# Patient Record
Sex: Male | Born: 1955 | Race: White | Hispanic: No | Marital: Married | Smoking: Never smoker
Health system: Southern US, Community
[De-identification: ages and names within clinical notes are randomized; demographics above are authoritative.]

## PROBLEM LIST (undated history)

## (undated) DIAGNOSIS — K219 Gastro-esophageal reflux disease without esophagitis: Secondary | ICD-10-CM

## (undated) DIAGNOSIS — I10 Essential (primary) hypertension: Secondary | ICD-10-CM

## (undated) DIAGNOSIS — Z87442 Personal history of urinary calculi: Secondary | ICD-10-CM

## (undated) DIAGNOSIS — K760 Fatty (change of) liver, not elsewhere classified: Secondary | ICD-10-CM

## (undated) DIAGNOSIS — T7801XA Anaphylactic reaction due to peanuts, initial encounter: Secondary | ICD-10-CM

## (undated) DIAGNOSIS — C4490 Unspecified malignant neoplasm of skin, unspecified: Secondary | ICD-10-CM

## (undated) DIAGNOSIS — G4730 Sleep apnea, unspecified: Secondary | ICD-10-CM

## (undated) DIAGNOSIS — L509 Urticaria, unspecified: Secondary | ICD-10-CM

## (undated) DIAGNOSIS — E119 Type 2 diabetes mellitus without complications: Secondary | ICD-10-CM

## (undated) DIAGNOSIS — Z9889 Other specified postprocedural states: Secondary | ICD-10-CM

## (undated) HISTORY — PX: OTHER SURGICAL HISTORY: SHX169

## (undated) HISTORY — DX: Urticaria, unspecified: L50.9

## (undated) HISTORY — PX: TONSILLECTOMY: SUR1361

## (undated) HISTORY — PX: ESOPHAGOGASTRODUODENOSCOPY ENDOSCOPY: SHX5814

---

## 2007-04-26 ENCOUNTER — Emergency Department (HOSPITAL_COMMUNITY): Admission: EM | Admit: 2007-04-26 | Discharge: 2007-04-27 | Payer: Self-pay | Admitting: Emergency Medicine

## 2011-01-08 LAB — I-STAT 8, (EC8 V) (CONVERTED LAB)
BUN: 12
Bicarbonate: 25.7 — ABNORMAL HIGH
Chloride: 106
HCT: 47
Hemoglobin: 16
Operator id: 282201
Potassium: 4.1
Sodium: 140

## 2011-01-08 LAB — POCT CARDIAC MARKERS
Myoglobin, poc: 57
Operator id: 282201
Troponin i, poc: <0.05

## 2011-01-08 LAB — CBC
HCT: 44.3
Hemoglobin: 15.2
MCHC: 34.4
MCV: 93
Platelets: 216
RDW: 12.3

## 2011-01-08 LAB — DIFFERENTIAL
Basophils Absolute: 0
Basophils Relative: 0
Eosinophils Absolute: 0.2
Eosinophils Relative: 2
Monocytes Absolute: 1

## 2011-03-30 ENCOUNTER — Emergency Department (HOSPITAL_COMMUNITY)
Admission: EM | Admit: 2011-03-30 | Discharge: 2011-03-30 | Disposition: A | Discharge status: Home Health | Payer: 59 | Source: Home / Self Care

## 2011-03-30 ENCOUNTER — Encounter: Payer: Self-pay | Admitting: Emergency Medicine

## 2011-03-30 DIAGNOSIS — J209 Acute bronchitis, unspecified: Secondary | ICD-10-CM

## 2011-03-30 HISTORY — DX: Sleep apnea, unspecified: G47.30

## 2011-03-30 HISTORY — DX: Essential (primary) hypertension: I10

## 2011-03-30 LAB — POCT URINALYSIS DIP (DEVICE)
Hgb urine dipstick: NEGATIVE
Nitrite: NEGATIVE
Protein, ur: 30 mg/dL — AB
pH: 5.5 (ref 5.0–8.0)

## 2011-03-30 MED ORDER — ALBUTEROL SULFATE HFA 108 (90 BASE) MCG/ACT IN AERS: 2.0000 | INHALATION_SPRAY | RESPIRATORY_TRACT | Status: DC | PRN

## 2011-03-30 MED ORDER — IPRATROPIUM BROMIDE 0.02 % IN SOLN
0.5000 mg | Freq: Once | RESPIRATORY_TRACT | Status: AC
  Administered 2011-03-30: 0.5 mg via RESPIRATORY_TRACT

## 2011-03-30 MED ORDER — ALBUTEROL SULFATE (5 MG/ML) 0.5% IN NEBU
2.5000 mg | INHALATION_SOLUTION | Freq: Once | RESPIRATORY_TRACT | Status: AC
  Administered 2011-03-30: 2.5 mg via RESPIRATORY_TRACT

## 2011-03-30 MED ORDER — DOXYCYCLINE HYCLATE 100 MG PO CAPS: 100.0000 mg | ORAL_CAPSULE | Freq: Two times a day (BID) | ORAL | Status: AC

## 2011-03-30 MED ORDER — BENZONATATE 100 MG PO CAPS: ORAL_CAPSULE | ORAL | Status: AC

## 2011-03-30 MED ORDER — PREDNISONE 20 MG PO TABS: 20.0000 mg | ORAL_TABLET | Freq: Two times a day (BID) | ORAL | Status: AC

## 2011-03-30 MED ORDER — ALBUTEROL SULFATE (5 MG/ML) 0.5% IN NEBU
INHALATION_SOLUTION | RESPIRATORY_TRACT | Status: AC
  Filled 2011-03-30: qty 1

## 2011-03-30 NOTE — ED Provider Notes (Signed)
History     CSN: [id number] Arrival date & time: 03/30/2011  9:27 AM   None     Chief Complaint  Patient presents with  . Cough    (Consider location/radiation/quality/duration/timing/severity/associated sxs/prior treatment) HPI Comments: Onset of cough 3 days ago. Chest congestion, cough is sometimes productive with greenish dark brown phlegm. No fever but has had chills. Has been in bed for the last 2 days. Appetite is decreased but is drinking fluids.   Patient is a 55 y.o. male presenting with cough. The history is provided by the patient.  Cough This is a new problem. The current episode started more than 2 days ago. The problem occurs every few minutes. The problem has not changed since onset.The cough is productive of purulent sputum. There has been no fever. Associated symptoms include chills, headaches, shortness of breath and wheezing. Pertinent negatives include no chest pain, no ear pain and no sore throat. He has tried cough syrup for the symptoms. The treatment provided mild relief. He is not a smoker. His past medical history does not include asthma.    Past Medical History  Diagnosis Date  . Hypertension     History reviewed. No pertinent past surgical history.  History reviewed. No pertinent family history.  History  Substance Use Topics  . Smoking status: Never Smoker   . Smokeless tobacco: Not on file  . Alcohol Use: Yes     Occasional      Review of Systems  Constitutional: Positive for chills.  HENT: Positive for congestion. Negative for ear pain, sore throat, postnasal drip and sinus pressure.   Respiratory: Positive for cough, shortness of breath and wheezing.   Cardiovascular: Negative for chest pain.  Genitourinary: Positive for decreased urine volume. Negative for dysuria, frequency and hematuria.  Neurological: Positive for headaches.    Allergies  Review of patient's allergies indicates no known allergies.  Home Medications   Current  Outpatient Rx  Name Route Sig Dispense Refill  . NYQUIL COLD & FLU PO Oral Take by mouth.      . METOPROLOL TARTRATE 25 MG PO TABS Oral Take 25 mg by mouth 2 (two) times daily.      . NEBIVOLOL HCL 10 MG PO TABS Oral Take 10 mg by mouth daily.      . TELMISARTAN 80 MG PO TABS Oral Take 80 mg by mouth daily.        BP 148/97  Pulse 81  Temp(Src) 99.7 F (37.6 C) (Oral)  Resp 16  SpO2 97%  Physical Exam  Nursing note and vitals reviewed. Constitutional: He appears well-developed and well-nourished. No distress.  HENT:  Head: Normocephalic and atraumatic.  Right Ear: Tympanic membrane, external ear and ear canal normal.  Left Ear: Tympanic membrane, external ear and ear canal normal.  Nose: Nose normal.  Mouth/Throat: Uvula is midline, oropharynx is clear and moist and mucous membranes are normal. No oropharyngeal exudate, posterior oropharyngeal edema or posterior oropharyngeal erythema.  Neck: Neck supple.  Cardiovascular: Normal rate, regular rhythm and normal heart sounds.   Pulmonary/Chest: Effort normal. No respiratory distress. He has decreased breath sounds (bilat). He has no wheezes. He has no rhonchi. He has no rales.  Lymphadenopathy:    He has no cervical adenopathy.  Neurological: He is alert.  Skin: Skin is warm and dry.  Psychiatric: He has a normal mood and affect.    ED Course  Procedures (including critical care time)  Labs Reviewed  POCT URINALYSIS DIP (DEVICE) -  Abnormal; Notable for the following:    Glucose, UA 100 (*)    Bilirubin Urine SMALL (*)    Protein, ur 30 (*)    All other components within normal limits  POCT URINALYSIS DIPSTICK  POCT URINALYSIS DIPSTICK   No results found.   No diagnosis found.    MDM  Pt reports symptomatic improvement with nebulizer treatment. Lungs CTA.         Melody Comas, [place] 03/30/11 1145

## 2011-03-30 NOTE — ED Notes (Signed)
Friday afternoon started having cough, got worse ever since. Stayed in bed all weekend. Cough, runny nose, chills, severe headache. Also has discolored, malodorous urine.

## 2011-03-31 NOTE — ED Provider Notes (Signed)
Medical screening examination/treatment/procedure(s) were performed by non-physician practitioner and as supervising physician I was immediately available for consultation/collaboration.  Corrie Mckusick, MD 03/31/11 [phone]

## 2012-03-24 ENCOUNTER — Emergency Department (HOSPITAL_COMMUNITY)
Admission: EM | Admit: 2012-03-24 | Discharge: 2012-03-25 | Disposition: A | Discharge status: Home / Self Care | Payer: 59 | Attending: Emergency Medicine | Admitting: Emergency Medicine

## 2012-03-24 DIAGNOSIS — T7809XA Anaphylactic reaction due to other food products, initial encounter: Secondary | ICD-10-CM | POA: Insufficient documentation

## 2012-03-24 DIAGNOSIS — Y939 Activity, unspecified: Secondary | ICD-10-CM | POA: Insufficient documentation

## 2012-03-24 DIAGNOSIS — R42 Dizziness and giddiness: Secondary | ICD-10-CM | POA: Insufficient documentation

## 2012-03-24 DIAGNOSIS — T782XXA Anaphylactic shock, unspecified, initial encounter: Secondary | ICD-10-CM

## 2012-03-24 DIAGNOSIS — L299 Pruritus, unspecified: Secondary | ICD-10-CM | POA: Insufficient documentation

## 2012-03-24 DIAGNOSIS — Y929 Unspecified place or not applicable: Secondary | ICD-10-CM | POA: Insufficient documentation

## 2012-03-24 DIAGNOSIS — R21 Rash and other nonspecific skin eruption: Secondary | ICD-10-CM | POA: Insufficient documentation

## 2012-03-24 DIAGNOSIS — I10 Essential (primary) hypertension: Secondary | ICD-10-CM | POA: Insufficient documentation

## 2012-03-24 DIAGNOSIS — T628X1A Toxic effect of other specified noxious substances eaten as food, accidental (unintentional), initial encounter: Secondary | ICD-10-CM | POA: Insufficient documentation

## 2012-03-24 MED ORDER — SODIUM CHLORIDE 0.9 % IV BOLUS (SEPSIS)
1000.0000 mL | Freq: Once | INTRAVENOUS | Status: AC
  Administered 2012-03-24: 1000 mL via INTRAVENOUS

## 2012-03-24 MED ORDER — FAMOTIDINE IN NACL 20-0.9 MG/50ML-% IV SOLN
20.0000 mg | Freq: Once | INTRAVENOUS | Status: AC
  Administered 2012-03-24: 20 mg via INTRAVENOUS
  Filled 2012-03-24: qty 50

## 2012-03-24 MED ORDER — ONDANSETRON HCL 4 MG/2ML IJ SOLN
4.0000 mg | Freq: Once | INTRAMUSCULAR | Status: AC
  Administered 2012-03-24: 4 mg via INTRAVENOUS
  Filled 2012-03-24: qty 2

## 2012-03-24 MED ORDER — METHYLPREDNISOLONE SODIUM SUCC 125 MG IJ SOLR
125.0000 mg | Freq: Once | INTRAMUSCULAR | Status: AC
  Administered 2012-03-24: 125 mg via INTRAVENOUS

## 2012-03-24 MED ORDER — METHYLPREDNISOLONE SODIUM SUCC 125 MG IJ SOLR
INTRAMUSCULAR | Status: AC
  Administered 2012-03-24: 125 mg via INTRAVENOUS
  Filled 2012-03-24: qty 2

## 2012-03-24 MED ORDER — EPINEPHRINE 0.15 MG/0.3ML IJ DEVI
0.1500 mg | Freq: Once | INTRAMUSCULAR | Status: AC
  Administered 2012-03-24: 0.15 mg via INTRAMUSCULAR

## 2012-03-24 NOTE — ED Notes (Signed)
EDP at bedside  

## 2012-03-24 NOTE — ED Notes (Addendum)
Pt was brought into triage room.  Pt diaphoretic and pale, pt has visble rash, states that his throat is swelling and that he is going to "pass out". Pt then passes out, gray and diaphoretic in color, not breathing.  EPI pen given IM.  Pt begins breathing again, placed on 15L NRB.  Pt becomes more alert.  Moved to trauma B

## 2012-03-24 NOTE — ED Notes (Signed)
Patient currently sitting up in bed; no respiratory or acute distress noted.  Patient updated on plan of care; informed patient that we are currently waiting on disposition from EDP.  Patient denies any other needs at this time.  Zofran given to help with nausea.  Will continue to monitor.

## 2012-03-24 NOTE — ED Provider Notes (Signed)
History     CSN: [id number]  Arrival date & time 03/24/12  2137   First MD Initiated Contact with Patient 03/24/12 2150      Chief Complaint  Patient presents with  . Allergic Reaction    (Consider location/radiation/quality/duration/timing/severity/associated sxs/prior treatment) Patient is a 56 y.o. male presenting with allergic reaction. The history is provided by the patient.  Allergic Reaction The primary symptoms are  shortness of breath, dizziness and rash. Primary symptoms comment: tongue swelling.  syncope that started after eating a [place]  coconut cream pie The current episode started 1 to 2 hours ago. The problem has been rapidly worsening.  The shortness of breath began today.  The rash began today. Location: diffuse. The rash is associated with blisters and itching.  Significant symptoms also include itching.    Past Medical History  Diagnosis Date  . Hypertension   . Sleep apnea     No past surgical history on file.  No family history on file.  History  Substance Use Topics  . Smoking status: Never Smoker   . Smokeless tobacco: Not on file  . Alcohol Use: Yes     Comment: Occasional      Review of Systems  Respiratory: Positive for shortness of breath.   Skin: Positive for itching and rash.  Neurological: Positive for dizziness.  All other systems reviewed and are negative.    Allergies  Review of patient's allergies indicates no known allergies.  Home Medications   Current Outpatient Rx  Name  Route  Sig  Dispense  Refill  . ALPRAZOLAM 1 MG PO TABS   Oral   Take 0.5 mg by mouth at bedtime.         . ASPIRIN EC 81 MG PO TBEC   Oral   Take 81 mg by mouth every morning.         Marland Kitchen DICLOFENAC SODIUM 75 MG PO TBEC   Oral   Take 75 mg by mouth daily as needed. For pain and inflammation         . NEBIVOLOL HCL 10 MG PO TABS   Oral   Take 10 mg by mouth every morning.          Marland Kitchen SIMVASTATIN 80 MG PO TABS   Oral   Take 80 mg by  mouth at bedtime.         . TELMISARTAN 80 MG PO TABS   Oral   Take 80 mg by mouth every morning.            BP 119/82  Pulse 58  Temp 97.6 F (36.4 C) (Oral)  Resp 18  SpO2 97%  Physical Exam  Nursing note and vitals reviewed. Constitutional: He is oriented to person, place, and time. He appears well-developed and well-nourished. He appears distressed.  HENT:  Head: Normocephalic and atraumatic.  Mouth/Throat: Oropharynx is clear and moist.  Eyes: Conjunctivae normal and EOM are normal. Pupils are equal, round, and reactive to light.  Neck: Normal range of motion. Neck supple.  Cardiovascular: Normal rate, regular rhythm and intact distal pulses.   No murmur heard. Pulmonary/Chest: Effort normal and breath sounds normal. No respiratory distress. He has no wheezes. He has no rales.  Abdominal: Soft. He exhibits no distension. There is no tenderness. There is no rebound and no guarding.  Musculoskeletal: Normal range of motion. He exhibits no edema and no tenderness.       Cool, pale toes with >3sec cap refill.  NOrmal  sensation and pulse.  Neurological: He is alert and oriented to person, place, and time.  Skin: Skin is warm. Rash noted. Rash is urticarial. He is diaphoretic. No erythema. There is pallor.  Psychiatric: He has a normal mood and affect. His behavior is normal.    ED Course  Procedures (including critical care time)  Labs Reviewed - No data to display No results found.   No diagnosis found.    MDM   Patient arrived with allergic reaction and anaphylaxis. The reaction started after eating Hilda Lias calendar [place] coconut cream pie.  Diffuse hives, tongue swelling, and diaphoresis, hypotension and syncope. Patient was given an EpiPen which significantly improved his symptoms within 2-3 minutes. On reevaluation after epi the patient is much improved.    Patient was given IV Solu-Medrol, Pepcid but he took 50 mg of Benadryl prior to arrival. Blood pressure  improved to 110/60. Heart rate normal and oxygen saturations 95%. He complained of feeling a little shaky but otherwise much better. He has a history of hypertension and hyperlipidemia but denies any cardiac stents or MIs in the past. He denies any chest pain or shortness of breath. Will serve patient for 4 hours to ensure no recurrent symptoms to see if he is stable for discharge home.        Gwyneth Sprout, MD 03/24/12 2312

## 2012-03-24 NOTE — ED Notes (Signed)
Patient given crackers and peanut butter, per request.  OKed by EDP.

## 2012-03-24 NOTE — ED Notes (Signed)
Pt began having sob, swelling of tongue and feelings that he would pass out as well as diaphoresis.  Pt arrives POV with wife.  Syncope in triage.  Rash on abdomen visible.  Pt took 2 benadryl pta.

## 2012-03-25 MED ORDER — PREDNISONE 20 MG PO TABS: 60.0000 mg | ORAL_TABLET | Freq: Every day | ORAL | Status: DC

## 2012-03-25 MED ORDER — DIPHENHYDRAMINE HCL 25 MG PO TABS: 25.0000 mg | ORAL_TABLET | Freq: Four times a day (QID) | ORAL | Status: DC

## 2012-03-25 MED ORDER — FAMOTIDINE 20 MG PO TABS: 20.0000 mg | ORAL_TABLET | Freq: Two times a day (BID) | ORAL | Status: DC

## 2012-03-25 MED ORDER — EPINEPHRINE 0.3 MG/0.3ML IJ DEVI: 0.3000 mg | Freq: Once | INTRAMUSCULAR | Status: DC

## 2012-03-25 NOTE — ED Notes (Signed)
Patient currently resting quietly in bed; no respiratory or acute distress noted.  Patient states that he is ready to go home; EPD and resident aware -- patient informed that we are currently waiting on the disposition from EDP.  Patient denies any other needs at this time; will continue to monitor.

## 2012-03-25 NOTE — ED Notes (Signed)
Patient given copy of discharge paperwork.  Went over discharge instructions with patient.  Instructed patient to take prescriptions as directed, to carry Epipen at all times, to follow up with allergist, and to return to the ED for new, worsening, or concerning symptoms.

## 2014-01-12 ENCOUNTER — Emergency Department (HOSPITAL_COMMUNITY)
Admission: EM | Admit: 2014-01-12 | Discharge: 2014-01-12 | Disposition: A | Discharge status: Home / Self Care | Payer: 59 | Attending: Emergency Medicine | Admitting: Emergency Medicine

## 2014-01-12 ENCOUNTER — Encounter (HOSPITAL_COMMUNITY): Payer: Self-pay | Admitting: Emergency Medicine

## 2014-01-12 DIAGNOSIS — T4995XA Adverse effect of unspecified topical agent, initial encounter: Secondary | ICD-10-CM | POA: Insufficient documentation

## 2014-01-12 DIAGNOSIS — I10 Essential (primary) hypertension: Secondary | ICD-10-CM | POA: Insufficient documentation

## 2014-01-12 DIAGNOSIS — R220 Localized swelling, mass and lump, head: Secondary | ICD-10-CM | POA: Insufficient documentation

## 2014-01-12 DIAGNOSIS — R221 Localized swelling, mass and lump, neck: Secondary | ICD-10-CM

## 2014-01-12 DIAGNOSIS — R21 Rash and other nonspecific skin eruption: Secondary | ICD-10-CM | POA: Diagnosis not present

## 2014-01-12 DIAGNOSIS — Z7982 Long term (current) use of aspirin: Secondary | ICD-10-CM | POA: Diagnosis not present

## 2014-01-12 DIAGNOSIS — Z791 Long term (current) use of non-steroidal anti-inflammatories (NSAID): Secondary | ICD-10-CM | POA: Insufficient documentation

## 2014-01-12 DIAGNOSIS — IMO0002 Reserved for concepts with insufficient information to code with codable children: Secondary | ICD-10-CM | POA: Diagnosis not present

## 2014-01-12 DIAGNOSIS — Z79899 Other long term (current) drug therapy: Secondary | ICD-10-CM | POA: Diagnosis not present

## 2014-01-12 DIAGNOSIS — T7840XA Allergy, unspecified, initial encounter: Secondary | ICD-10-CM

## 2014-01-12 HISTORY — DX: Anaphylactic reaction due to peanuts, initial encounter: T78.01XA

## 2014-01-12 MED ORDER — PREDNISONE 20 MG PO TABS: 60.0000 mg | ORAL_TABLET | Freq: Every day | ORAL | Status: DC

## 2014-01-12 MED ORDER — METHYLPREDNISOLONE SODIUM SUCC 125 MG IJ SOLR
INTRAMUSCULAR | Status: AC
  Filled 2014-01-12: qty 2

## 2014-01-12 MED ORDER — FAMOTIDINE IN NACL 20-0.9 MG/50ML-% IV SOLN
20.0000 mg | Freq: Once | INTRAVENOUS | Status: AC
  Administered 2014-01-12: 20 mg via INTRAVENOUS

## 2014-01-12 MED ORDER — METHYLPREDNISOLONE SODIUM SUCC 125 MG IJ SOLR
125.0000 mg | Freq: Once | INTRAMUSCULAR | Status: AC
  Administered 2014-01-12: 125 mg via INTRAVENOUS

## 2014-01-12 MED ORDER — SODIUM CHLORIDE 0.9 % IV BOLUS (SEPSIS)
1000.0000 mL | Freq: Once | INTRAVENOUS | Status: AC
Start: 2014-01-12 — End: 2014-01-12
  Administered 2014-01-12: 1000 mL via INTRAVENOUS

## 2014-01-12 MED ORDER — FAMOTIDINE IN NACL 20-0.9 MG/50ML-% IV SOLN
INTRAVENOUS | Status: AC
  Filled 2014-01-12: qty 50

## 2014-01-12 MED ORDER — DIPHENHYDRAMINE HCL 50 MG/ML IJ SOLN
INTRAMUSCULAR | Status: AC
  Administered 2014-01-12: 50 mg
  Filled 2014-01-12: qty 1

## 2014-01-12 MED ORDER — EPINEPHRINE HCL 1 MG/ML IJ SOLN
INTRAMUSCULAR | Status: AC
  Filled 2014-01-12: qty 1

## 2014-01-12 MED ORDER — EPINEPHRINE HCL 1 MG/ML IJ SOLN
0.3000 mg | Freq: Once | INTRAMUSCULAR | Status: AC
  Administered 2014-01-12: 0.3 mg via INTRAMUSCULAR

## 2014-01-12 NOTE — Discharge Instructions (Signed)
Food Allergy °A food allergy occurs from eating something you are sensitive to. Food allergies occur in all age groups. It may be passed to you from your parents (heredity).  °CAUSES  °Some common causes are cow's milk, seafood, eggs, nuts (including peanut butter), wheat, and soybeans. °SYMPTOMS  °Common problems are:  °· Swelling around the mouth. °· An itchy, red rash. °· Hives. °· Vomiting. °· Diarrhea. °Severe allergic reactions are life-threatening. This reaction is called anaphylaxis. It can cause the mouth and throat to swell. This makes it hard to breathe and swallow. In severe reactions, only a small amount of food may be fatal within seconds. °HOME CARE INSTRUCTIONS  °· If you are unsure what caused the reaction, keep a diary of foods eaten and symptoms that followed. Avoid foods that cause reactions. °· If hives or rash are present: °¨ Take medicines as directed. °¨ Use an over-the-counter antihistamine (diphenhydramine) to treat hives and itching as needed. °¨ Apply cold compresses to the skin or take baths in cool water. Avoid hot baths or showers. These will increase the redness and itching. °· If you are severely allergic: °¨ Hospitalization is often required following a severe reaction. °¨ Wear a medical alert bracelet or necklace that describes the allergy. °¨ Carry your anaphylaxis kit or epinephrine injection with you at all times. Both you and your family members should know how to use this. This can be lifesaving if you have a severe reaction. If epinephrine is used, it is important for you to seek immediate medical care or call your local emergency services (911 in [place] When the epinephrine wears off, it can be followed by a delayed reaction, which can be fatal. °· Replace your epinephrine immediately after use in case of another reaction. °· Ask your caregiver for instructions if you have not been taught how to use an epinephrine injection. °· Do not drive until medicines used to treat the  reaction have worn off, unless approved by your caregiver. °SEEK MEDICAL CARE IF:  °· You suspect a food allergy. Symptoms generally happen within 30 minutes of eating a food. °· Your symptoms have not gone away within 2 days. See your caregiver sooner if symptoms are getting worse. °· You develop new symptoms. °· You want to retest yourself with a food or drink you think causes an allergic reaction. Never do this if an anaphylactic reaction to that food or drink has happened before. °· There is a return of the symptoms which brought you to your caregiver. °SEEK IMMEDIATE MEDICAL CARE IF:  °· You have trouble breathing, are wheezing, or you have a tight feeling in your chest or throat. °· You have a swollen mouth, or you have hives, swelling, or itching all over your body. Use your epinephrine injection immediately. This is given into the outside of your thigh, deep into the muscle. Following use of the epinephrine injection, seek help right away. °Seek immediate medical care or call your local emergency services (911 in [place] °MAKE SURE YOU:  °· Understand these instructions. °· Will watch your condition. °· Will get help right away if you are not doing well or get worse. °Document Released: 04/03/2000 Document Revised: 06/29/2011 Document Reviewed: 11/24/2007 °[employer]® Patient Information ©2015 [employer]. This information is not intended to replace advice given to you by your health care provider. Make sure you discuss any questions you have with your health care provider. ° °

## 2014-01-12 NOTE — ED Notes (Signed)
Patient discharged home with prescription for Prednisone.  Patient ambulatory with steady gait; discharged home in good condition.

## 2014-01-12 NOTE — ED Notes (Signed)
Pt states he started having allergic reaction to something, states it started approx 30 mins ago, now with swelling of lips and eyes, states previous anaphylaxis.

## 2014-01-12 NOTE — ED Notes (Signed)
Patient states he feels better and is ready to go home.  Dr. Alvino Chapel at bedside to talk with patient.

## 2014-01-12 NOTE — ED Provider Notes (Signed)
CSN: [id number]     Arrival date & time 01/12/14  0120 History   First MD Initiated Contact with Patient 01/12/14 0123     Chief Complaint  Patient presents with  . Allergic Reaction     (Consider location/radiation/quality/duration/timing/severity/associated sxs/prior Treatment) Patient is a 58 y.o. male presenting with allergic reaction. The history is provided by the patient.  Allergic Reaction Presenting symptoms: itching, rash and swelling   Presenting symptoms: no difficulty breathing, no difficulty swallowing and no wheezing    patient has a history of anaphylaxis to peanuts. States that he woke with some itching to his hands no swelling to his face. Came to the ER. It began around 30 minutes prior to arrival. He states that they sat in the parking lot for a few minutes to see if it got better. Patient states he now has more swelling in his face feels as if his lips and tongue are tight. No chest pain. No difficulty with swallowing or breathing. No known exposure. States he's not eaten anything in 4 hours. No chest pain. No coronary artery disease history  Past Medical History  Diagnosis Date  . Hypertension   . Sleep apnea   . 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 No family history on file. History  Substance Use Topics  . Smoking status: Never Smoker   . Smokeless tobacco: Not on file  . Alcohol Use: Yes     Comment: Occasional    Review of Systems  Constitutional: Negative for fever and appetite change.  HENT: Positive for facial swelling. Negative for drooling and trouble swallowing.   Respiratory: Negative for chest tightness, shortness of breath and wheezing.   Musculoskeletal: Negative for joint swelling.  Skin: Positive for itching and rash.  Neurological: Negative for seizures.      Allergies  Review of patient's allergies indicates no known allergies.  Home Medications   Prior to Admission medications   Medication Sig Start  Date End Date Taking? Authorizing Provider  diphenhydrAMINE (BENADRYL) 25 MG tablet Take 1 tablet (25 mg total) by mouth every 6 (six) hours. 03/25/12  Yes Kalman Drape, MD  EPINEPHrine (EPIPEN) 0.3 mg/0.3 mL DEVI Inject 0.3 mLs (0.3 mg total) into the muscle once. 03/25/12  Yes Kalman Drape, MD  ALPRAZolam Duanne Moron) 1 MG tablet Take 0.5 mg by mouth at bedtime.    Historical Provider, MD  aspirin EC 81 MG tablet Take 81 mg by mouth every morning.    Historical Provider, MD  diclofenac (VOLTAREN) 75 MG EC tablet Take 75 mg by mouth daily as needed. For pain and inflammation    Historical Provider, MD  famotidine (PEPCID) 20 MG tablet Take 1 tablet (20 mg total) by mouth 2 (two) times daily. 03/25/12   Kalman Drape, MD  nebivolol (BYSTOLIC) 10 MG tablet Take 10 mg by mouth every morning.     Historical Provider, MD  predniSONE (DELTASONE) 20 MG tablet Take 3 tablets (60 mg total) by mouth daily. 03/25/12   Kalman Drape, MD  predniSONE (DELTASONE) 20 MG tablet Take 3 tablets (60 mg total) by mouth daily. 01/12/14   Jasper Riling. Ardra Kuznicki, MD  simvastatin (ZOCOR) 80 MG tablet Take 80 mg by mouth at bedtime.    Historical Provider, MD  telmisartan (MICARDIS) 80 MG tablet Take 80 mg by mouth every morning.     Historical Provider, MD   BP 121/64  Pulse 74  Temp(Src) 97.8 F (36.6  C) (Oral)  Resp 18  Ht 6' (1.829 m)  Wt 215 lb (97.523 kg)  BMI 29.15 kg/m2  SpO2 98% Physical Exam  Nursing note and vitals reviewed. Constitutional: He is oriented to person, place, and time. He appears well-developed and well-nourished.  HENT:  Head: Normocephalic and atraumatic.  Eyes: EOM are normal. Pupils are equal, round, and reactive to light.  Neck: Normal range of motion. Neck supple.  Cardiovascular: Normal rate, regular rhythm and normal heart sounds.   No murmur heard. Pulmonary/Chest: Effort normal and breath sounds normal.  Abdominal: Soft. Bowel sounds are normal. He exhibits no distension and no mass. There  is no tenderness. There is no rebound and no guarding.  Musculoskeletal: Normal range of motion. He exhibits no edema.  Neurological: He is alert and oriented to person, place, and time. No cranial nerve deficit.  Skin: Skin is warm and dry.  Diffuse hives, worse on the face and upper chest.  Psychiatric: He has a normal mood and affect.    ED Course  Procedures (including critical care time) Labs Review Labs Reviewed - No data to display  Imaging Review No results found.   EKG Interpretation None      MDM   Final diagnoses:  Allergic reaction, initial encounter    Patient with allergic reaction to unknown cause. Much better after steroids Benadryl Pepcid and EpiPen. His been monitored for 3 hours in ER. On recheck hives have resolved. Patient states his tongue and mouth feels better, although there is no real swelling visible to start with. Will discharge home followup with primary care or his allergist we'll give today course of steroids    Jasper Riling. Alvino Chapel, MD 01/12/14 [phone]

## 2014-01-12 NOTE — ED Notes (Signed)
Redness of skin has lessened; patient states still feels like upper lip is numb.  Patient shaking all over due to Epinephrine injection.  Patient with hives noted.

## 2014-01-12 NOTE — ED Notes (Signed)
Redness of skin resolved.  Patient appears more calm and breathing has slowed down.

## 2014-02-01 ENCOUNTER — Encounter (HOSPITAL_COMMUNITY): Payer: Self-pay | Admitting: Emergency Medicine

## 2014-02-01 ENCOUNTER — Emergency Department (INDEPENDENT_AMBULATORY_CARE_PROVIDER_SITE_OTHER)
Admission: EM | Admit: 2014-02-01 | Discharge: 2014-02-01 | Disposition: A | Discharge status: Home / Self Care | Payer: 59 | Source: Home / Self Care | Attending: Family Medicine | Admitting: Family Medicine

## 2014-02-01 DIAGNOSIS — S65212A Laceration of superficial palmar arch of left hand, initial encounter: Secondary | ICD-10-CM

## 2014-02-01 MED ORDER — TETANUS-DIPHTH-ACELL PERTUSSIS 5-2.5-18.5 LF-MCG/0.5 IM SUSP
INTRAMUSCULAR | Status: AC
  Filled 2014-02-01: qty 0.5

## 2014-02-01 MED ORDER — TETANUS-DIPHTH-ACELL PERTUSSIS 5-2.5-18.5 LF-MCG/0.5 IM SUSP
0.5000 mL | Freq: Once | INTRAMUSCULAR | Status: AC
  Administered 2014-02-01: 0.5 mL via INTRAMUSCULAR

## 2014-02-01 MED ORDER — BACITRACIN ZINC 500 UNIT/GM EX OINT
TOPICAL_OINTMENT | CUTANEOUS | Status: AC
  Filled 2014-02-01: qty 0.9

## 2014-02-01 NOTE — ED Notes (Signed)
Left hand laceration , bleeding controlled.

## 2014-02-01 NOTE — Discharge Instructions (Signed)
Leave bandaged until sat then wash as needed, cover to protect, use bacitracin twice a day, you had a tetanus booster, return as needed.

## 2014-02-01 NOTE — ED Provider Notes (Signed)
CSN: [id number]     Arrival date & time 02/01/14  1651 History   First MD Initiated Contact with Patient 02/01/14 1715     Chief Complaint  Patient presents with  . Laceration   (Consider location/radiation/quality/duration/timing/severity/associated sxs/prior Treatment) Patient is a 58 y.o. male presenting with skin laceration. The history is provided by the patient.  Laceration Location:  Hand Hand laceration location:  L palm Length (cm):  2.5 Depth:  Cutaneous Quality: jagged   Bleeding: controlled   Time since incident:  1 hour Laceration mechanism:  Metal edge (truck tie down strap broke and buckle cut left palm.) Pain details:    Progression:  Unchanged Foreign body present:  No foreign bodies Worsened by:  Nothing tried Tetanus status:  Out of date   Past Medical History  Diagnosis Date  . Hypertension   . Sleep apnea   . 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 No pertinent past surgical history. No family history on file. History  Substance Use Topics  . Smoking status: Never Smoker   . Smokeless tobacco: Not on file  . Alcohol Use: Yes     Comment: Occasional    Review of Systems  Constitutional: Negative.   Skin: Positive for wound.    Allergies  Review of patient's allergies indicates no known allergies.  Home Medications   Prior to Admission medications   Medication Sig Start Date End Date Taking? Authorizing Provider  ALPRAZolam Duanne Moron) 1 MG tablet Take 0.5 mg by mouth at bedtime.   Yes Historical Provider, MD  aspirin EC 81 MG tablet Take 81 mg by mouth every morning.   Yes Historical Provider, MD  omeprazole (PRILOSEC) 20 MG capsule Take 20 mg by mouth daily.   Yes Historical Provider, MD  silodosin (RAPAFLO) 8 MG CAPS capsule Take 8 mg by mouth daily with breakfast.   Yes Historical Provider, MD  diclofenac (VOLTAREN) 75 MG EC tablet Take 75 mg by mouth daily as needed. For pain and inflammation    Historical Provider, MD   diphenhydrAMINE (BENADRYL) 25 MG tablet Take 1 tablet (25 mg total) by mouth every 6 (six) hours. 03/25/12   Kalman Drape, MD  EPINEPHrine (EPIPEN) 0.3 mg/0.3 mL DEVI Inject 0.3 mLs (0.3 mg total) into the muscle once. 03/25/12   Kalman Drape, MD  famotidine (PEPCID) 20 MG tablet Take 1 tablet (20 mg total) by mouth 2 (two) times daily. 03/25/12   Kalman Drape, MD  nebivolol (BYSTOLIC) 10 MG tablet Take 10 mg by mouth every morning.     Historical Provider, MD  predniSONE (DELTASONE) 20 MG tablet Take 3 tablets (60 mg total) by mouth daily. 03/25/12   Kalman Drape, MD  predniSONE (DELTASONE) 20 MG tablet Take 3 tablets (60 mg total) by mouth daily. 01/12/14   Jasper Riling. Pickering, MD  simvastatin (ZOCOR) 80 MG tablet Take 80 mg by mouth at bedtime.    Historical Provider, MD  telmisartan (MICARDIS) 80 MG tablet Take 80 mg by mouth every morning.     Historical Provider, MD   BP 143/94  Pulse 75  Temp(Src) 98.5 F (36.9 C) (Oral)  Resp 16  SpO2 97% Physical Exam  Nursing note and vitals reviewed. Constitutional: He is oriented to person, place, and time. He appears well-developed and well-nourished.  Musculoskeletal: Normal range of motion.  Neurological: He is alert and oriented to person, place, and time.  Skin: Skin is warm and dry.  Jagged flap lac  to left thenar web space, nvt intact.    ED Course  LACERATION REPAIR Date/Time: 02/01/2014 5:38 PM Performed by: Billy Fischer Authorized by: Ihor Gully D Consent: Verbal consent obtained. Risks and benefits: risks, benefits and alternatives were discussed Consent given by: patient Body area: upper extremity Location details: left hand Laceration length: 2.5 cm Tendon involvement: none Nerve involvement: none Vascular damage: no Patient sedated: no Preparation: Patient was prepped and draped in the usual sterile fashion. Irrigation solution: saline Amount of cleaning: standard Debridement: minimal Degree of undermining:  none Approximation: loose Dressing: antibiotic ointment and non-adhesive packing strip Patient tolerance: Patient tolerated the procedure well with no immediate complications. Comments: Betadine soak, xeroform dsd.   (including critical care time) Labs Review Labs Reviewed - No data to display  Imaging Review No results found.   MDM   1. Laceration of superficial palmar arch of hand, left, initial encounter        Billy Fischer, MD 02/01/14 [phone]

## 2015-12-16 ENCOUNTER — Emergency Department (HOSPITAL_COMMUNITY): Payer: Commercial Managed Care - HMO

## 2015-12-16 ENCOUNTER — Emergency Department (HOSPITAL_COMMUNITY)
Admission: EM | Admit: 2015-12-16 | Discharge: 2015-12-16 | Disposition: A | Discharge status: Home / Self Care | Payer: Commercial Managed Care - HMO | Attending: Emergency Medicine | Admitting: Emergency Medicine

## 2015-12-16 ENCOUNTER — Encounter (HOSPITAL_COMMUNITY): Payer: Self-pay | Admitting: Emergency Medicine

## 2015-12-16 DIAGNOSIS — R001 Bradycardia, unspecified: Secondary | ICD-10-CM | POA: Diagnosis not present

## 2015-12-16 DIAGNOSIS — R42 Dizziness and giddiness: Secondary | ICD-10-CM | POA: Diagnosis present

## 2015-12-16 DIAGNOSIS — I10 Essential (primary) hypertension: Secondary | ICD-10-CM | POA: Diagnosis not present

## 2015-12-16 DIAGNOSIS — H5500 Unspecified nystagmus: Secondary | ICD-10-CM | POA: Insufficient documentation

## 2015-12-16 DIAGNOSIS — Z7982 Long term (current) use of aspirin: Secondary | ICD-10-CM | POA: Diagnosis not present

## 2015-12-16 LAB — CBC
HEMATOCRIT: 43.3 % (ref 39.0–52.0)
HEMOGLOBIN: 15 g/dL (ref 13.0–17.0)
MCH: 32.5 pg (ref 26.0–34.0)
MCHC: 34.6 g/dL (ref 30.0–36.0)
MCV: 93.7 fL (ref 78.0–100.0)
Platelets: 183 10*3/uL (ref 150–400)
RBC: 4.62 MIL/uL (ref 4.22–5.81)
RDW: 12.5 % (ref 11.5–15.5)
WBC: 9.5 10*3/uL (ref 4.0–10.5)

## 2015-12-16 LAB — URINALYSIS, ROUTINE W REFLEX MICROSCOPIC
Bilirubin Urine: NEGATIVE
GLUCOSE, UA: NEGATIVE mg/dL
HGB URINE DIPSTICK: NEGATIVE
Ketones, ur: NEGATIVE mg/dL
Leukocytes, UA: NEGATIVE
Nitrite: NEGATIVE
PH: 6.5 (ref 5.0–8.0)
Protein, ur: NEGATIVE mg/dL

## 2015-12-16 LAB — TSH: TSH: 0.362 u[IU]/mL (ref 0.350–4.500)

## 2015-12-16 LAB — BASIC METABOLIC PANEL
Anion gap: 1 — ABNORMAL LOW (ref 5–15)
BUN: 15 mg/dL (ref 6–20)
CO2: 29 mmol/L (ref 22–32)
Calcium: 9.7 mg/dL (ref 8.9–10.3)
Chloride: 110 mmol/L (ref 101–111)
Creatinine, Ser: 0.8 mg/dL (ref 0.61–1.24)
GFR calc Af Amer: >60 mL/min (ref >60)
GFR calc non Af Amer: >60 mL/min (ref >60)
GLUCOSE: 118 mg/dL — AB (ref 65–99)
POTASSIUM: 4.2 mmol/L (ref 3.5–5.1)
Sodium: 140 mmol/L (ref 135–145)

## 2015-12-16 LAB — CBG MONITORING, ED: Glucose-Capillary: 142 mg/dL — ABNORMAL HIGH (ref 65–99)

## 2015-12-16 MED ORDER — SODIUM CHLORIDE 0.9 % IV BOLUS (SEPSIS)
500.0000 mL | Freq: Once | INTRAVENOUS | Status: AC
  Administered 2015-12-16: 500 mL via INTRAVENOUS

## 2015-12-16 MED ORDER — NEBIVOLOL HCL 5 MG PO TABS: 5.0000 mg | ORAL_TABLET | Freq: Every morning | ORAL | 0 refills | Status: DC

## 2015-12-16 NOTE — ED Provider Notes (Signed)
[facility] DEPT Provider Note   CSN: [id number] Arrival date & time: 12/16/15  1002  By signing my name below, I, Soijett Blue, attest that this documentation has been prepared under the direction and in the presence of Davonna Belling, MD. Electronically Signed: Soijett Blue, ED Scribe. 12/16/15. 12:49 PM.   History   Chief Complaint Chief Complaint  Patient presents with  . Dizziness    HPI  Antonio Houston is a 60 y.o. male with a medical hx of HTN who presents to the Emergency Department complaining of dizziness x pass out onset 4 AM this morning. Pt reports that he was laying in bed when his dizziness began and it feels as if he is going to pass out. Pt denies room spinning sensation or feeling drunk sensation. Pt notes that yesterday he was at his baseline and did some yard work. Pt states that he has similar symptoms 3-4 days ago. Pt states that he had his blood pressure checked by family members who are nurse practitioners and a chiropractor who found a different blood pressure on his right arm than the left arm with a manual and electrical cuff. Pt denies any change in his medications and notes that he takes his medications daily.  Pt states that he has lost 45 lbs due to being borderline diabetic and to keep from being placed on insulin. He states that he is having associated symptoms of resolved nausea, and resolved sudden onset vomiting x 1 week ago.  He states that he has not tried any medications for the relief of his symptoms. He denies weight loss, vision change, CP, and any other symptoms.   The history is provided by the patient. No language interpreter was used.    Past Medical History:  Diagnosis Date  . 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 Hypertension   . Sleep apnea     There are no active problems to display for this patient.   History reviewed. No pertinent surgical history.     Home Medications    Prior to Admission medications   Medication Sig Start  Date End Date Taking? Authorizing Provider  ALPRAZolam Duanne Moron) 1 MG tablet Take 0.5 mg by mouth at bedtime.    Historical Provider, MD  aspirin EC 81 MG tablet Take 81 mg by mouth every morning.    Historical Provider, MD  diclofenac (VOLTAREN) 75 MG EC tablet Take 75 mg by mouth daily as needed. For pain and inflammation    Historical Provider, MD  diphenhydrAMINE (BENADRYL) 25 MG tablet Take 1 tablet (25 mg total) by mouth every 6 (six) hours. 03/25/12   Linton Flemings, MD  EPINEPHrine (EPIPEN) 0.3 mg/0.3 mL DEVI Inject 0.3 mLs (0.3 mg total) into the muscle once. 03/25/12   Linton Flemings, MD  famotidine (PEPCID) 20 MG tablet Take 1 tablet (20 mg total) by mouth 2 (two) times daily. 03/25/12   Linton Flemings, MD  nebivolol (BYSTOLIC) 5 MG tablet Take 1 tablet (5 mg total) by mouth every morning. 12/16/15   Davonna Belling, MD  omeprazole (PRILOSEC) 20 MG capsule Take 20 mg by mouth daily.    Historical Provider, MD  predniSONE (DELTASONE) 20 MG tablet Take 3 tablets (60 mg total) by mouth daily. 03/25/12   Linton Flemings, MD  predniSONE (DELTASONE) 20 MG tablet Take 3 tablets (60 mg total) by mouth daily. 01/12/14   Davonna Belling, MD  silodosin (RAPAFLO) 8 MG CAPS capsule Take 8 mg by mouth daily with breakfast.  Historical Provider, MD  simvastatin (ZOCOR) 80 MG tablet Take 80 mg by mouth at bedtime.    Historical Provider, MD  telmisartan (MICARDIS) 80 MG tablet Take 80 mg by mouth every morning.     Historical Provider, MD    Family History History reviewed. No pertinent family history.  Social History Social History  Substance Use Topics  . Smoking status: Never Smoker  . Smokeless tobacco: Not on file  . Alcohol use Yes     Comment: Occasional     Allergies   Peanut-containing drug products   Review of Systems Review of Systems  Constitutional: Negative for unexpected weight change.  Eyes: Negative for visual disturbance.  Cardiovascular: Negative for chest pain.  Gastrointestinal:  Positive for nausea (resolved) and vomiting (resolved).  Neurological: Positive for dizziness.     Physical Exam Updated Vital Signs BP 149/87 (BP Location: Right Arm)   Pulse (!) 49   Temp 98.6 F (37 C) (Oral)   Resp 18   Ht 5\' 11"  (1.803 m)   Wt 202 lb (91.6 kg)   SpO2 98%   BMI 28.17 kg/m   Physical Exam  Constitutional: He is oriented to person, place, and time. He appears well-developed and well-nourished. No distress.  HENT:  Head: Normocephalic and atraumatic.  Eyes: Right eye exhibits nystagmus. Left eye exhibits nystagmus.  Mild horizontal nystagmus bilaterally.   Neck: Neck supple.  Cardiovascular: Normal rate, regular rhythm and normal heart sounds.  Exam reveals no gallop and no friction rub.   No murmur heard. Equal pulses bilaterally.   Pulmonary/Chest: Effort normal and breath sounds normal. No respiratory distress. He has no wheezes. He has no rales.  Abdominal: Soft. He exhibits no distension. There is no tenderness.  Musculoskeletal: Normal range of motion.  Neurological: He is alert and oriented to person, place, and time. He has normal strength. No cranial nerve deficit.  Good grip strength. Nl finger-to-nose test.   Skin: Skin is warm and dry.  Psychiatric: He has a normal mood and affect. His behavior is normal.  Nursing note and vitals reviewed.    ED Treatments / Results   COORDINATION OF CARE: 12:48 PM Discussed treatment plan with pt at bedside which includes labs, EKG, and pt agreed to plan.   Labs (all labs ordered are listed, but only abnormal results are displayed) Labs Reviewed  BASIC METABOLIC PANEL - Abnormal; Notable for the following:       Result Value   Glucose, Bld 118 (*)    Anion gap 1 (*)    All other components within normal limits  URINALYSIS, ROUTINE W REFLEX MICROSCOPIC (NOT AT [facility]) - Abnormal; Notable for the following:    Color, Urine STRAW (*)    Specific Gravity, Urine <1.005 (*)    All other components within  normal limits  CBG MONITORING, ED - Abnormal; Notable for the following:    Glucose-Capillary 142 (*)    All other components within normal limits  CBC  TSH  CBG MONITORING, ED    EKG  EKG Interpretation  Date/Time:  Monday December 16 2015 10:39:36 EDT Ventricular Rate:  55 PR Interval:  158 QRS Duration: 96 QT Interval:  408 QTC Calculation: 390 R Axis:   61 Text Interpretation:  Sinus bradycardia with occasional Premature ventricular complexes Borderline ECG Confirmed by Alvino Chapel  MD, Beautifull Cisar [phone]) on 12/16/2015 12:36:28 PM       Radiology Dg Chest 2 View  Result Date: 12/16/2015 CLINICAL DATA:  Dizziness EXAM:  CHEST  2 VIEW COMPARISON:  04/26/2007 chest radiograph. FINDINGS: Stable cardiomediastinal silhouette with normal heart size. No pneumothorax. No pleural effusion. Lungs appear clear, with no acute consolidative airspace disease and no pulmonary edema. IMPRESSION: No active cardiopulmonary disease. Electronically Signed   By: Ilona Sorrel M.D.   On: 12/16/2015 13:49   Mr Brain Wo Contrast  Result Date: 12/16/2015 CLINICAL DATA:  Dizziness for 1 day.  No known injury. EXAM: MRI HEAD WITHOUT CONTRAST TECHNIQUE: Multiplanar, multiecho pulse sequences of the brain and surrounding structures were obtained without intravenous contrast. COMPARISON:  None. FINDINGS: No evidence for acute infarction, hemorrhage, mass lesion, hydrocephalus, or extra-axial fluid. Normal cerebral volume. Minor white matter disease, not unexpected for age. Falx ossification anteriorly. Flow voids are maintained. No hemorrhage. Extracranial soft tissues unremarkable. No osseous lesions. IMPRESSION: Unremarkable brain MRI without contrast. No acute or focal intracranial abnormality. Electronically Signed   By: Staci Righter M.D.   On: 12/16/2015 15:05    Procedures Procedures (including critical care time)  Medications Ordered in ED Medications  sodium chloride 0.9 % bolus 500 mL (0 mLs Intravenous  Stopped 12/16/15 1428)     Initial Impression / Assessment and Plan / ED Course  I have reviewed the triage vital signs and the nursing notes.  Pertinent labs & imaging results that were available during my care of the patient were reviewed by me and considered in my medical decision making (see chart for details).  Clinical Course    Patient with dizziness. States it feels as if she will pass out. However he does feel nauseous when he closes and opens his eyes. Has some nystagmus. Mild bradycardia. Maintain blood pressure. MRI done to rule out stroke and was negative. Lab work reassuring. Will cut back on patient's beta blocker follow-up his primary care doctor. Nonfocal neurologic exam.  Final Clinical Impressions(s) / ED Diagnoses   Final diagnoses:  Dizziness  Bradycardia    New Prescriptions Discharge Medication List as of 12/16/2015  3:43 PM      I personally performed the services described in this documentation, which was scribed in my presence. The recorded information has been reviewed and is accurate.       Davonna Belling, MD 12/16/15 [phone]

## 2015-12-16 NOTE — Discharge Instructions (Signed)
Decrease your and follow with your Doctor.

## 2015-12-16 NOTE — ED Triage Notes (Signed)
Pt reports dizziness since 0400 this morning.  Pt has dizziness with standing and has also measured bp at home laying down and then sitting up and wife noticed 40 point drop in bp when sitting up.  Pt denies unilateral weakness, no facial droop at this time.  Pt had episode of vomiting last Monday and has been fine until this morning.  Pt has been nauseated today.

## 2016-12-26 ENCOUNTER — Ambulatory Visit (HOSPITAL_COMMUNITY)
Admission: EM | Admit: 2016-12-26 | Discharge: 2016-12-27 | Disposition: A | Discharge status: Home / Self Care | Payer: BLUE CROSS/BLUE SHIELD | Attending: Family Medicine | Admitting: Family Medicine

## 2016-12-26 ENCOUNTER — Encounter (HOSPITAL_COMMUNITY): Payer: Self-pay | Admitting: Emergency Medicine

## 2016-12-26 DIAGNOSIS — S90552A Superficial foreign body, left ankle, initial encounter: Secondary | ICD-10-CM

## 2016-12-26 MED ORDER — CEPHALEXIN 500 MG PO CAPS: 500.0000 mg | ORAL_CAPSULE | Freq: Four times a day (QID) | ORAL | 0 refills | Status: DC

## 2016-12-26 NOTE — ED Triage Notes (Signed)
Today while moving a family member, hit left foot/ankle on driftwood.  Laceration to left foot/ankle.  Bleeding controlled.  Family member has tried to remove wood from below the skin.  Visible redness, puffiness

## 2016-12-26 NOTE — ED Provider Notes (Signed)
[facility]    CSN: [id number] Arrival date & time: 12/26/16  1945     History   Chief Complaint Chief Complaint  Patient presents with  . Laceration    HPI Antonio Houston is a 61 y.o. male.   61 year old male was helping move some furniture and other objects and his left inner ankle struck the edge of prep wooden object which created a laceration and a large fragment of a wooden foreign body to the medial aspect of the left ankle.      Past Medical History:  Diagnosis Date  . 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 Hypertension   . Sleep apnea     There are no active problems to display for this patient.   No past surgical history on file.     Home Medications    Prior to Admission medications   Medication Sig Start Date End Date Taking? Authorizing Provider  ALPRAZolam Duanne Moron) 1 MG tablet Take 0.5 mg by mouth at bedtime.    [provider]  aspirin EC 81 MG tablet Take 81 mg by mouth every morning.    [provider]  cephALEXin (KEFLEX) 500 MG capsule Take 1 capsule (500 mg total) by mouth 4 (four) times daily. 12/26/16   Janne Napoleon, NP  diclofenac (VOLTAREN) 75 MG EC tablet Take 75 mg by mouth daily as needed. For pain and inflammation    [provider]  diphenhydrAMINE (BENADRYL) 25 MG tablet Take 1 tablet (25 mg total) by mouth every 6 (six) hours. 03/25/12   Linton Flemings, MD  EPINEPHrine (EPIPEN) 0.3 mg/0.3 mL DEVI Inject 0.3 mLs (0.3 mg total) into the muscle once. 03/25/12   Linton Flemings, MD  famotidine (PEPCID) 20 MG tablet Take 1 tablet (20 mg total) by mouth 2 (two) times daily. 03/25/12   Linton Flemings, MD  nebivolol (BYSTOLIC) 5 MG tablet Take 1 tablet (5 mg total) by mouth every morning. 12/16/15   Davonna Belling, MD  omeprazole (PRILOSEC) 20 MG capsule Take 20 mg by mouth daily.    [provider]  silodosin (RAPAFLO) 8 MG CAPS capsule Take 8 mg by mouth daily with breakfast.    [provider]    simvastatin (ZOCOR) 80 MG tablet Take 80 mg by mouth at bedtime.    [provider]  telmisartan (MICARDIS) 80 MG tablet Take 80 mg by mouth every morning.     [provider]    Family History Family History  Problem Relation Age of Onset  . Diabetes Mother   . Diabetes Father     Social History Social History  Substance Use Topics  . Smoking status: Never Smoker  . Smokeless tobacco: Not on file  . Alcohol use Yes     Comment: Occasional     Allergies   Peanut-containing drug products   Review of Systems Review of Systems  Constitutional: Negative.   Musculoskeletal: Negative.   Skin: Positive for wound.  Hematological: Negative.   All other systems reviewed and are negative.    Physical Exam Triage Vital Signs ED Triage Vitals  Enc Vitals Group     BP 12/26/16 2020 (!) 144/71     Pulse Rate 12/26/16 2020 60     Resp 12/26/16 2020 18     Temp 12/26/16 2020 99 F (37.2 C)     Temp Source 12/26/16 2020 Oral     SpO2 12/26/16 2020 97 %     Weight --  Height --      Head Circumference --      Peak Flow --      Pain Score 12/26/16 2017 2     Pain Loc --      Pain Edu? --      Excl. in [place]? --    No data found.   Updated Vital Signs BP (!) 144/71 (BP Location: Left Arm)   Pulse 60   Temp 99 F (37.2 C) (Oral)   Resp 18   SpO2 97%   Visual Acuity Right Eye Distance:   Left Eye Distance:   Bilateral Distance:    Right Eye Near:   Left Eye Near:    Bilateral Near:     Physical Exam  Constitutional: He is oriented to person, place, and time. He appears well-developed and well-nourished. No distress.  Musculoskeletal: Normal range of motion. He exhibits no deformity.  Neurological: He is alert and oriented to person, place, and time.  Skin:  There is a 2 cm superficial laceration to the medial aspect of the left ankle just distal to the medial malleolus. Just under the dermis is palpated a triangular fragment of wood  approximately 1 cm at the base and the point to base approximately 1 cm. No involvement of nerves, blood vessels, muscles or tendons.  Nursing note and vitals reviewed.    UC Treatments / Results  Labs (all labs ordered are listed, but only abnormal results are displayed) Labs Reviewed - No data to display  EKG  EKG Interpretation None       Radiology No results found.  Procedures .Foreign Body Removal Date/Time: 12/26/2016 9:16 PM Performed by: Marcha Dutton, Manasseh Pittsley Authorized by: Vanessa Kick  Consent: Verbal consent obtained. Risks and benefits: risks, benefits and alternatives were discussed Consent given by: patient Patient understanding: patient states understanding of the procedure being performed Site marked: the operative site was marked Patient identity confirmed: verbally with patient Body area: skin Anesthesia: local infiltration  Anesthesia: Local Anesthetic: lidocaine 2% with epinephrine Anesthetic total: 4 mL  Sedation: Patient sedated: no Patient restrained: no Patient cooperative: yes Localization method: probed and visualized Removal mechanism: forceps and scalpel Dressing: dressing applied Tendon involvement: none Depth: subcutaneous Complexity: complex 1 objects recovered. Objects recovered: 2 Post-procedure assessment: foreign body removed Patient tolerance: Patient tolerated the procedure well with no immediate complications Comments: A deeper incision was created over the original entrance laceration. An additional incision approximately 3 mm created right  angles at the midline of the incision to allow corner of the foreign body to be exposed. The foreign body was grasped by small hemostats and brought out by 2 segments. No other foreign body was seen. The wound was copiously irrigated. Did not close the wound as it was well approximated and more as an incision and laceration. Bandage was placed and instructions given for care. The wound was  subcutaneous and did not go to the depth of tendons or other working structures.    (including critical care time)  Medications Ordered in UC Medications - No data to display   Initial Impression / Assessment and Plan / UC Course  I have reviewed the triage vital signs and the nursing notes.  Pertinent labs & imaging results that were available during my care of the patient were reviewed by me and considered in my medical decision making (see chart for details).     Keep wound clean. Wash with soap and water daily. Watch for any signs of infection. If  there is redness, swelling, pus or drainage or other signs of infection return immediately.   Final Clinical Impressions(s) / UC Diagnoses   Final diagnoses:  Foreign body of ankle, left, initial encounter    New Prescriptions New Prescriptions   CEPHALEXIN (KEFLEX) 500 MG CAPSULE    Take 1 capsule (500 mg total) by mouth 4 (four) times daily.     Controlled Substance Prescriptions [place] Controlled Substance Registry consulted? Not Applicable   Janne Napoleon, NP 12/26/16 2119

## 2016-12-26 NOTE — Discharge Instructions (Signed)
Keep wound clean. Wash with soap and water daily. Watch for any signs of infection. If there is redness, swelling, pus or drainage or other signs of infection return immediately.

## 2016-12-27 NOTE — ED Notes (Signed)
Unable to back-document dressing change to correct date and time, which occurred at 2110 12/26/16.

## 2017-05-13 IMAGING — MR MR HEAD W/O CM
8 of 10 series · 35 of 48 positions shown · non-contrast
Comparison: None.

CLINICAL DATA: Dizziness for 1 day.  No known injury.

EXAM:
MRI HEAD WITHOUT CONTRAST
TECHNIQUE: Multiplanar, multiecho pulse sequences of the brain and surrounding
structures were obtained without intravenous contrast.

[Series 2: [id number] · sagittal · 5.0mm · 0.44mm/px · 2 of 20 slices shown]
[im 1/20]
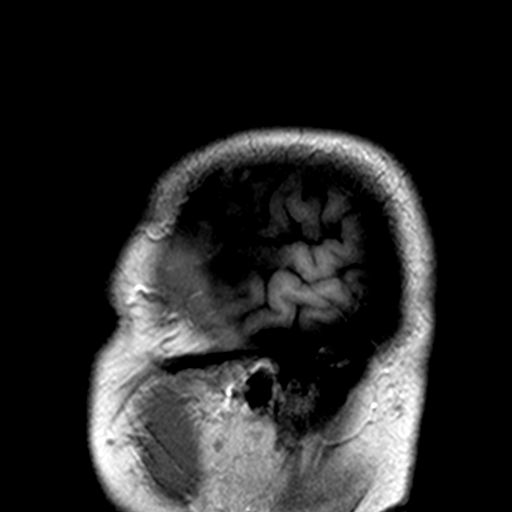
[im 20/20]
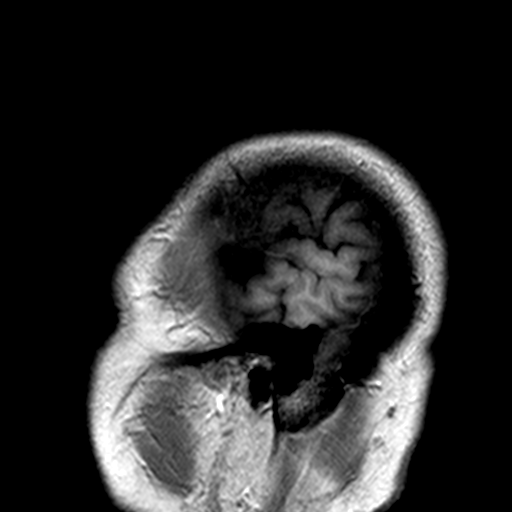

[Series 5: T2 · axial · 5.0mm · 0.60mm/px · z∈[-80,+63]mm · 3 of 23 slices shown (1 of 2)]
[im 1/23]
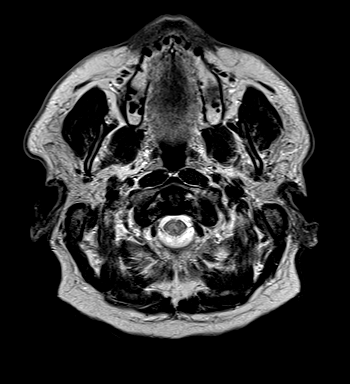
[im 12/23]
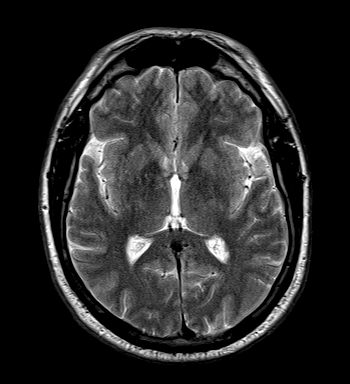
[im 23/23]
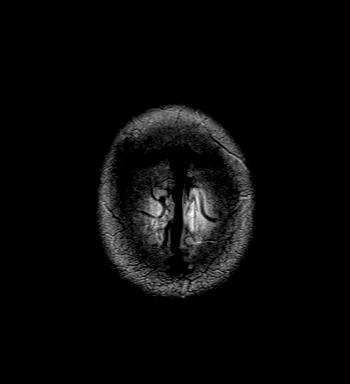

[Series 6: FLAIR · axial · 5.0mm · 0.94mm/px · z∈[-80,+62]mm · 3 of 23 slices shown]
[im 1/23]
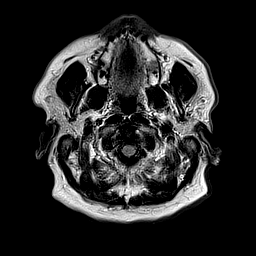
[im 12/23]
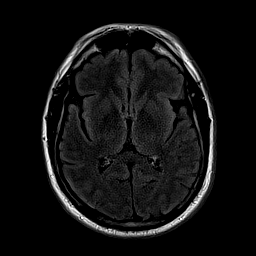
[im 23/23]
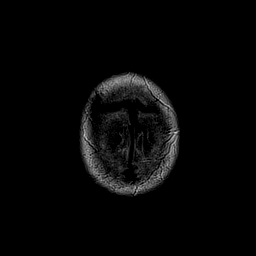

[Series 7: T1 · axial · 2.0mm · 0.47mm/px · z∈[-92,+95]mm · 11 of 95 slices shown]
[im 1/95]
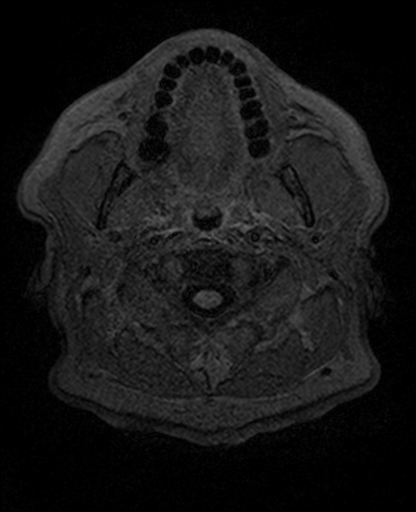
[im 10/95]
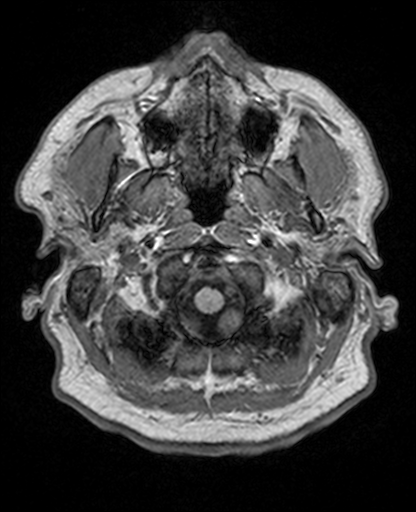
[im 19/95]
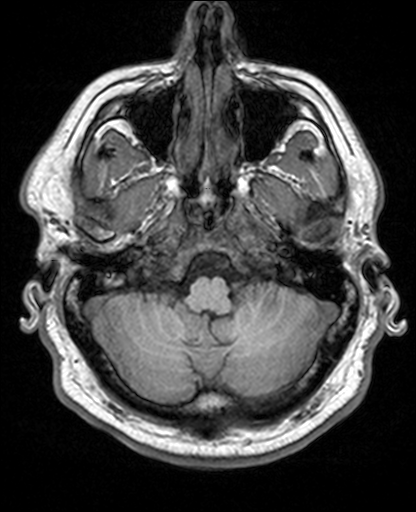
[im 29/95]
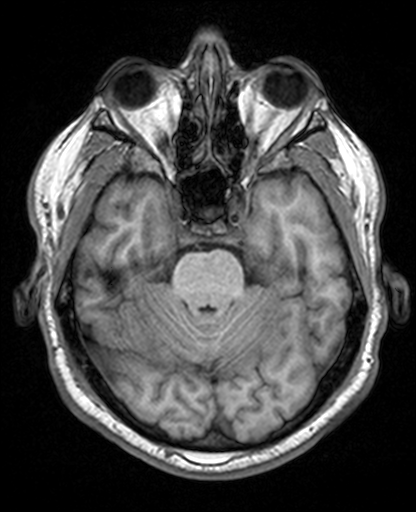
[im 38/95]
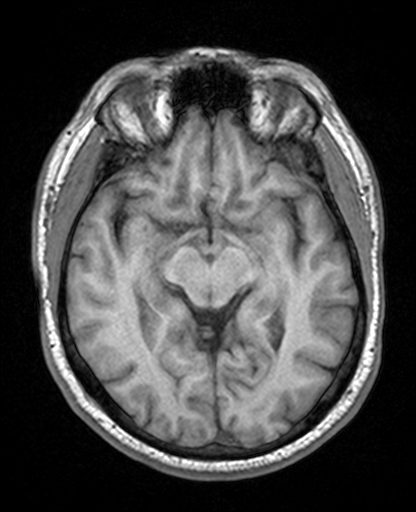
[im 48/95]
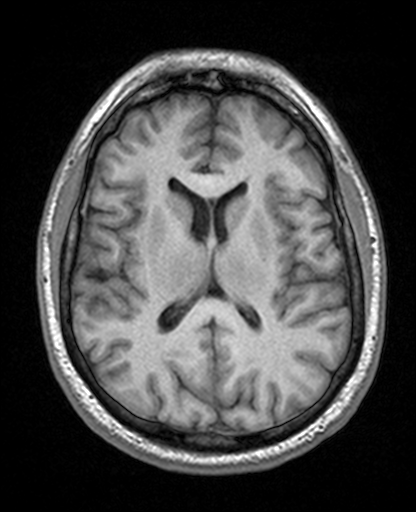
[im 57/95]
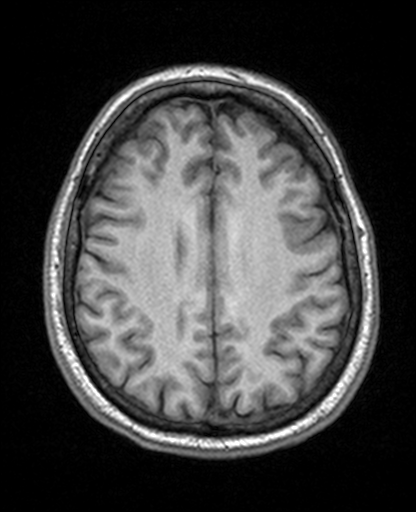
[im 66/95]
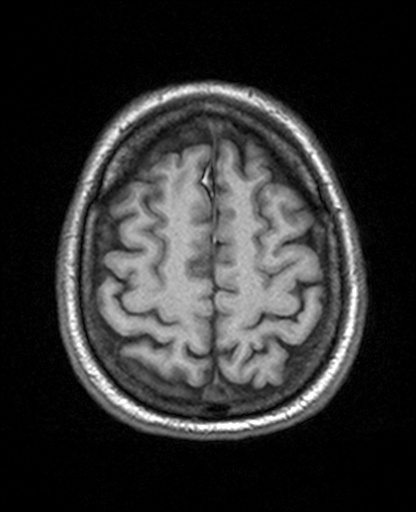
[im 76/95]
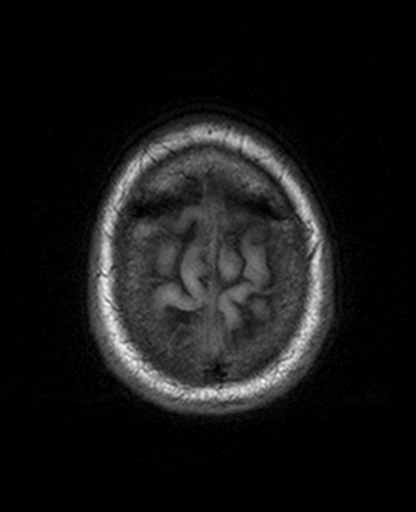
[im 85/95]
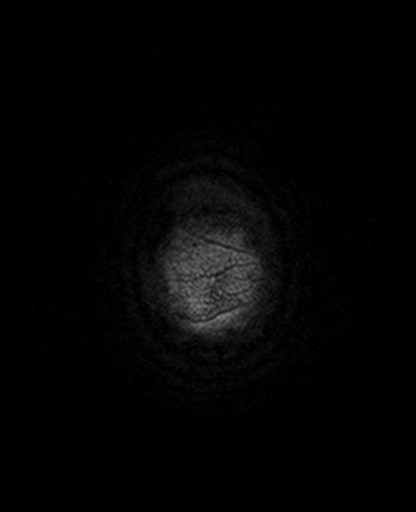
[im 95/95]
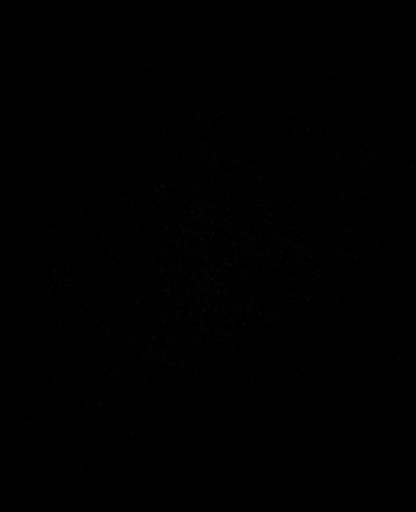

[Series 8: trauma axial · axial · 5.0mm · 0.45mm/px · z∈[-74,+56]mm · 2 of 21 slices shown]
[im 1/21]
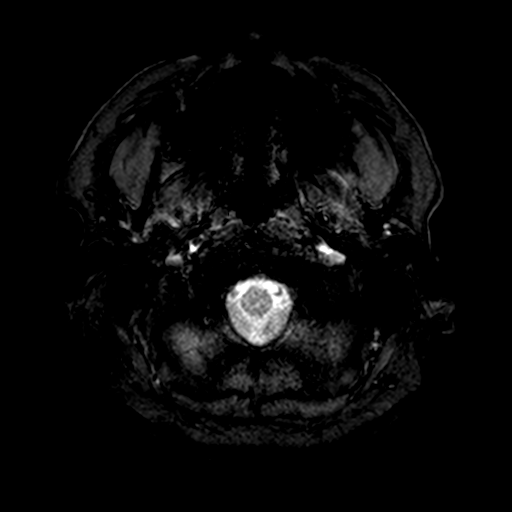
[im 21/21]
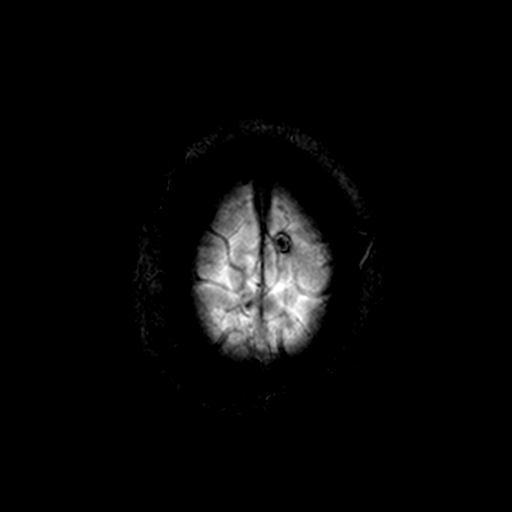

[Series 9: T2 · coronal · 5.0mm · 0.64mm/px · 3 of 28 slices shown (2 of 2)]
[im 1/28]
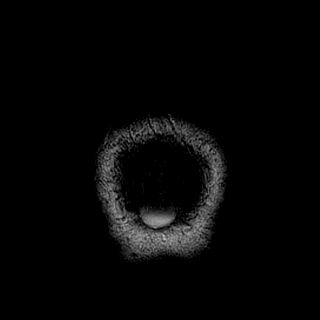
[im 14/28]
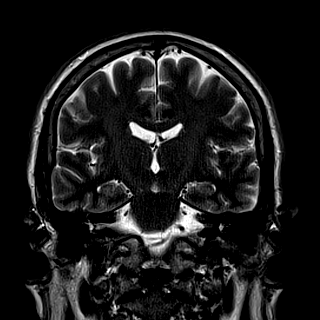
[im 28/28]
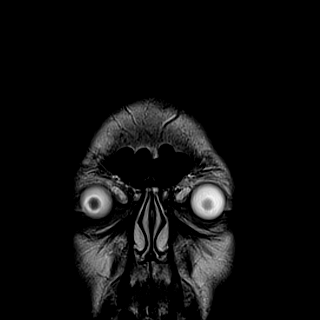

[Series 100: <mpr thick range> · axial · 3.0mm · 0.82mm/px · z∈[-77,+65]mm · 6 of 48 slices shown]
[im 1/48]
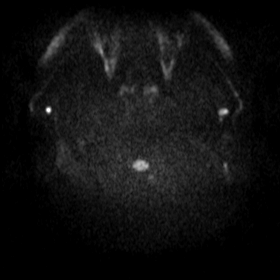
[im 10/48]
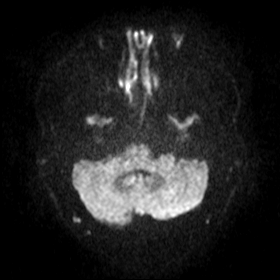
[im 19/48]
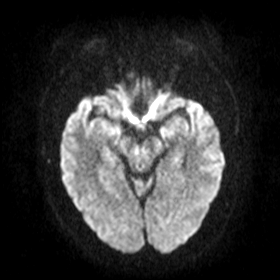
[im 29/48]
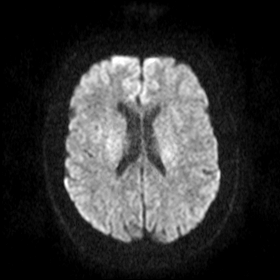
[im 38/48]
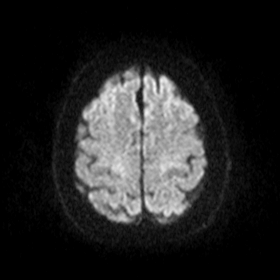
[im 48/48]
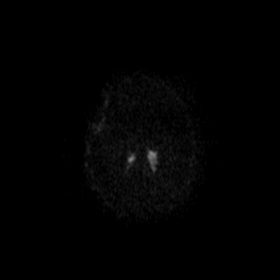

[Series 101: <mpr thick range(1)> · coronal · 3.0mm · 0.82mm/px · 5 of 55 slices shown]
[im 1/55]
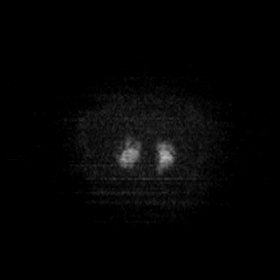
[im 11/55]
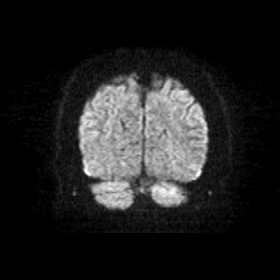
[im 22/55]
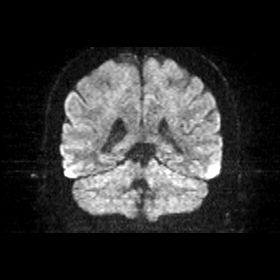
[im 33/55]
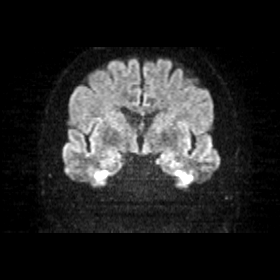
[im 44/55]
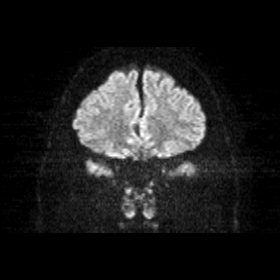

[35 of 48 positions shown; findings below may reference images not displayed]

FINDINGS: No evidence for acute infarction, hemorrhage, mass lesion,
hydrocephalus, or extra-axial fluid. Normal cerebral volume. Minor
white matter disease, not unexpected for age. Falx ossification
anteriorly. Flow voids are maintained. No hemorrhage. Extracranial
soft tissues unremarkable. No osseous lesions.
IMPRESSION: Unremarkable brain MRI without contrast. No acute or focal
intracranial abnormality.

## 2017-11-26 ENCOUNTER — Encounter (HOSPITAL_COMMUNITY): Payer: Self-pay

## 2017-11-26 ENCOUNTER — Other Ambulatory Visit: Payer: Self-pay

## 2017-11-26 ENCOUNTER — Emergency Department (HOSPITAL_COMMUNITY)
Admission: EM | Admit: 2017-11-26 | Discharge: 2017-11-27 | Disposition: A | Discharge status: Home / Self Care | Payer: 59 | Attending: Emergency Medicine | Admitting: Emergency Medicine

## 2017-11-26 DIAGNOSIS — Z91010 Allergy to peanuts: Secondary | ICD-10-CM | POA: Insufficient documentation

## 2017-11-26 DIAGNOSIS — T7840XA Allergy, unspecified, initial encounter: Secondary | ICD-10-CM | POA: Insufficient documentation

## 2017-11-26 DIAGNOSIS — I10 Essential (primary) hypertension: Secondary | ICD-10-CM | POA: Diagnosis not present

## 2017-11-26 DIAGNOSIS — Z7982 Long term (current) use of aspirin: Secondary | ICD-10-CM | POA: Diagnosis not present

## 2017-11-26 DIAGNOSIS — Z79899 Other long term (current) drug therapy: Secondary | ICD-10-CM | POA: Insufficient documentation

## 2017-11-26 MED ORDER — FAMOTIDINE IN NACL 20-0.9 MG/50ML-% IV SOLN
20.0000 mg | Freq: Once | INTRAVENOUS | Status: AC
  Administered 2017-11-27: 20 mg via INTRAVENOUS
  Filled 2017-11-26: qty 50

## 2017-11-26 MED ORDER — METHYLPREDNISOLONE SODIUM SUCC 125 MG IJ SOLR
125.0000 mg | Freq: Once | INTRAMUSCULAR | Status: AC
  Administered 2017-11-27: 125 mg via INTRAVENOUS
  Filled 2017-11-26: qty 2

## 2017-11-26 NOTE — ED Triage Notes (Signed)
Pt with allergic reaction to unknown substance starting approx 2130 tonight.  Pt had an epi pen at home which his wife gave to him approx 2215.  Pt had swelling and redness to his face and around his eyes.  Pt also has some hives to under both armpits.  Pt denies pain or resp diff.  Pt admits to some mild swelling to his lips and tongue that is improved after meds.   EMS gave 25 mg benadryl iv.

## 2017-11-27 MED ORDER — FAMOTIDINE 20 MG PO TABS: 20.0000 mg | ORAL_TABLET | Freq: Two times a day (BID) | ORAL | 0 refills | Status: DC

## 2017-11-27 MED ORDER — EPINEPHRINE 0.3 MG/0.3ML IJ SOAJ: 0.3000 mg | Freq: Once | INTRAMUSCULAR | 0 refills | Status: AC

## 2017-11-27 MED ORDER — PREDNISONE 20 MG PO TABS: ORAL_TABLET | ORAL | 0 refills | Status: DC

## 2017-11-27 NOTE — ED Provider Notes (Signed)
[facility] EMERGENCY DEPARTMENT Provider Note   CSN: [id number] Arrival date & time: 11/26/17  2319  Time seen 23:38 PM    History   Chief Complaint Chief Complaint  Patient presents with  . Allergic Reaction    HPI Antonio Houston is a 62 y.o. male.  HPI patient states he has had 3 prior episodes of allergic reactions with anaphylaxis mainly related to eating foods in factories where they have tree nuts.  He denies any change in his diet, he is careful of what things he buys now and how it is processed.  He states his eyes felt irritated and itchy about 6 PM.  About 9:30 PM while watching TV he started noticing his palms of his hands swelling and itching and his lips felt numb.  He states this is happened before with his prior allergic reactions.  He told his wife and she noted he was having urticarial lesions and is axilla.  About 1015 he took 25 of Benadryl and about 10:20 PM he was given epinephrine by his wife.  EMS was called and they gave him an additional 25 mg of Benadryl IV.  He states currently his palms are still itching and feels swollen, his eyes are still little itchy and have some mild swelling.  States his top lip still feels numb.  He states his tongue felt thick but he denies any swelling of his lips or feeling like his throat was swelling.  He states he only felt short of breath after he got the EpiPen but not now.  He denies any chest pain.  PCP Franne Forts, MD   Past Medical History:  Diagnosis Date  . 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 Hypertension   . Sleep apnea     There are no active problems to display for this patient.   History reviewed. No pertinent surgical history.      Home Medications    Prior to Admission medications   Medication Sig Start Date End Date Taking? Authorizing Provider  ALPRAZolam Duanne Moron) 1 MG tablet Take 0.5 mg by mouth at bedtime.    [provider]  aspirin EC 81 MG tablet Take 81 mg by mouth every morning.     [provider]  cephALEXin (KEFLEX) 500 MG capsule Take 1 capsule (500 mg total) by mouth 4 (four) times daily. 12/26/16   Janne Napoleon, NP  diclofenac (VOLTAREN) 75 MG EC tablet Take 75 mg by mouth daily as needed. For pain and inflammation    [provider]  diphenhydrAMINE (BENADRYL) 25 MG tablet Take 1 tablet (25 mg total) by mouth every 6 (six) hours. 03/25/12   Linton Flemings, MD  EPINEPHrine 0.3 mg/0.3 mL IJ SOAJ injection Inject 0.3 mLs (0.3 mg total) into the muscle once for 1 dose. 11/27/17 11/27/17  Rolland Porter, MD  famotidine (PEPCID) 20 MG tablet Take 1 tablet (20 mg total) by mouth 2 (two) times daily. 11/27/17   Rolland Porter, MD  nebivolol (BYSTOLIC) 5 MG tablet Take 1 tablet (5 mg total) by mouth every morning. 12/16/15   Davonna Belling, MD  omeprazole (PRILOSEC) 20 MG capsule Take 20 mg by mouth daily.    [provider]  predniSONE (DELTASONE) 20 MG tablet Take 3 po QD x 3d , then 2 po QD x 3d then 1 po QD x 3d 11/27/17   Rolland Porter, MD  silodosin (RAPAFLO) 8 MG CAPS capsule Take 8 mg by mouth daily with breakfast.  [provider]  simvastatin (ZOCOR) 80 MG tablet Take 80 mg by mouth at bedtime.    [provider]  telmisartan (MICARDIS) 80 MG tablet Take 80 mg by mouth every morning.     [provider]    Family History Family History  Problem Relation Age of Onset  . Diabetes Mother   . Diabetes Father     Social History Social History   Tobacco Use  . Smoking status: Never Smoker  . Smokeless tobacco: Never Used  Substance Use Topics  . Alcohol use: Yes    Comment: Occasional  . Drug use: No  employed Lives with spouse   Allergies   Peanut-containing drug products and Bee venom   Review of Systems Review of Systems  All other systems reviewed and are negative.    Physical Exam Updated Vital Signs BP (!) 145/77 (BP Location: Left Arm)   Pulse 84   Temp 97.8 F (36.6 C) (Oral)   Resp (!) 23   Ht 5'  11" (1.803 m)   Wt 95.3 kg   SpO2 98%   BMI 29.29 kg/m   Vital signs normal    Physical Exam  Constitutional: He is oriented to person, place, and time. He appears well-developed and well-nourished.  Non-toxic appearance. He does not appear ill. No distress.  HENT:  Head: Normocephalic and atraumatic.  Right Ear: External ear normal.  Left Ear: External ear normal.  Nose: Nose normal. No mucosal edema or rhinorrhea.  Mouth/Throat: Oropharynx is clear and moist and mucous membranes are normal. No dental abscesses or uvula swelling.  Eyes: Pupils are equal, round, and reactive to light. Conjunctivae and EOM are normal.  Neck: Normal range of motion and full passive range of motion without pain. Neck supple.  Cardiovascular: Normal rate, regular rhythm and normal heart sounds. Exam reveals no gallop and no friction rub.  No murmur heard. Pulmonary/Chest: Effort normal and breath sounds normal. No respiratory distress. He has no wheezes. He has no rhonchi. He has no rales. He exhibits no tenderness and no crepitus.  Abdominal: Soft. Normal appearance and bowel sounds are normal. He exhibits no distension. There is no tenderness. There is no rebound and no guarding.  Musculoskeletal: Normal range of motion. He exhibits no edema or tenderness.  Moves all extremities well.   Neurological: He is alert and oriented to person, place, and time. He has normal strength. No cranial nerve deficit.  Skin: Skin is warm, dry and intact. No rash noted. No erythema. No pallor.  Patient has some mild swelling of the palms of his hands him a he appears to have some mild swelling of the eyelids of both eyes with some mild redness.  He has some small urticarial lesions around his wrists.  Psychiatric: He has a normal mood and affect. His speech is normal and behavior is normal. His mood appears not anxious.  Nursing note and vitals reviewed.    ED Treatments / Results  Labs (all labs ordered are listed,  but only abnormal results are displayed) Labs Reviewed - No data to display  EKG EKG Interpretation  Date/Time:  Friday November 26 2017 23:24:28 EDT Ventricular Rate:  82 PR Interval:    QRS Duration: 96 QT Interval:  368 QTC Calculation: 430 R Axis:   47 Text Interpretation:  Sinus rhythm Since last tracing 16 Dec 2015 Borderline repolarization abnormality is now present Confirmed by Rolland Porter [phone]) on 11/26/2017 11:30:14 PM   Radiology No results found.  Procedures Procedures (including critical care time)  Medications Ordered in ED Medications  methylPREDNISolone sodium succinate (SOLU-MEDROL) 125 mg/2 mL injection 125 mg (125 mg Intravenous Given 11/27/17 0007)  famotidine (PEPCID) IVPB 20 mg premix (0 mg Intravenous Stopped 11/27/17 0042)     Initial Impression / Assessment and Plan / ED Course  I have reviewed the triage vital signs and the nursing notes.  Pertinent labs & imaging results that were available during my care of the patient were reviewed by me and considered in my medical decision making (see chart for details).     Patient had already been given Benadryl at home and Benadryl by EMS.  He was given IV Pepcid and IV Solu-Medrol.  Recheck at 1:25 AM patient states he feels much better.  His eyes are less red and no longer look puffy or swollen.  His palms now he states  no longer feels tight.  He feels ready to be discharged.  Final Clinical Impressions(s) / ED Diagnoses   Final diagnoses:  Allergic reaction, initial encounter    ED Discharge Orders         Ordered    famotidine (PEPCID) 20 MG tablet  2 times daily     11/27/17 0147    predniSONE (DELTASONE) 20 MG tablet     11/27/17 0147    EPINEPHrine 0.3 mg/0.3 mL IJ SOAJ injection   Once     11/27/17 0147          Plan discharge  Rolland Porter, MD, Barbette Or, MD 11/27/17 [phone]

## 2017-11-27 NOTE — Discharge Instructions (Addendum)
Take the prednisone and pepcid as prescribed. You can take zyrtec once a day for itching and rash. Take Benadryl 50 mg every 6 hrs as needed for itching or rash not controlled by the other medications. Return to the ED for any worsening symptoms. Use the epipen if you have another episode at home.

## 2017-12-22 ENCOUNTER — Ambulatory Visit: Payer: BLUE CROSS/BLUE SHIELD

## 2017-12-22 ENCOUNTER — Telehealth: Payer: Self-pay

## 2017-12-22 NOTE — Telephone Encounter (Signed)
PATIENT WAS A NO SHOW AND LETTER SENT  °

## 2017-12-22 NOTE — Telephone Encounter (Signed)
noted 

## 2018-01-18 ENCOUNTER — Ambulatory Visit (INDEPENDENT_AMBULATORY_CARE_PROVIDER_SITE_OTHER): Payer: Self-pay

## 2018-01-18 DIAGNOSIS — Z86010 Personal history of colonic polyps: Secondary | ICD-10-CM

## 2018-01-18 MED ORDER — NA SULFATE-K SULFATE-MG SULF 17.5-3.13-1.6 GM/177ML PO SOLN: 1.0000 | ORAL | 0 refills | Status: DC

## 2018-01-18 NOTE — Progress Notes (Addendum)
Gastroenterology Pre-Procedure Review  Request Date:01/18/18 Requesting Physician: Franne Forts MD- [place] [facility]. Last tcs 12/20/14- [facility] Rondall Allegra. Multiple diverticula with 4 adenomas, 3 year repeat recommended.  Pt used to work in [place] and he had all his procedures done there. Pt is not working in [place] anymore and wants procedures done local now.  PATIENT REVIEW QUESTIONS: The patient responded to the following health history questions as indicated:    1. Diabetes Melitis: no 2. Joint replacements in the past 12 months: no 3. Major health problems in the past 3 months: no 4. Has an artificial valve or MVP: no 5. Has a defibrillator: no 6. Has been advised in past to take antibiotics in advance of a procedure like teeth cleaning: no 7. Family history of colon cancer: no  8. Alcohol Use: yes (social) 9. History of sleep apnea: yes (cpap)  10. History of coronary artery or other vascular stents placed within the last 12 months: no 11. History of any prior anesthesia complications: no    MEDICATIONS & ALLERGIES:    Patient reports the following regarding taking any blood thinners:   Plavix? no Aspirin? yes (81mg ) Coumadin? no Brilinta? no Xarelto? no Eliquis? no Pradaxa? no Savaysa? no Effient? no  Patient confirms/reports the following medications:  Current Outpatient Medications  Medication Sig Dispense Refill  . aspirin EC 81 MG tablet Take 81 mg by mouth every morning.    Marland Kitchen EPINEPHrine 0.3 mg/0.3 mL IJ SOAJ injection Inject into the muscle.    . nebivolol (BYSTOLIC) 5 MG tablet Take 1 tablet (5 mg total) by mouth every morning. 10 tablet 0  . Omega-3 Fatty Acids (OMEGA-3 EPA FISH OIL PO) Take by mouth.    Marland Kitchen omeprazole (PRILOSEC) 20 MG capsule Take 20 mg by mouth daily.    . simvastatin (ZOCOR) 80 MG tablet Take 80 mg by mouth at bedtime.    Marland Kitchen telmisartan (MICARDIS) 80 MG tablet Take 80 mg by mouth every  morning.     Marland Kitchen VITAMIN D, CHOLECALCIFEROL, PO Take by mouth.     No current facility-administered medications for this visit.     Patient confirms/reports the following allergies:  Allergies  Allergen Reactions  . Peanut-Containing Drug Products Anaphylaxis  . Bee Venom     No orders of the defined types were placed in this encounter.   AUTHORIZATION INFORMATION Primary Insurance: [facility],  [place] #: [id number] Pre-Cert / Josem Kaufmann required:yes Pre-Cert / Auth #: [id number]  SCHEDULE INFORMATION: Procedure has been scheduled as follows:  Date: 03/28/18, Time: 10:30 Location:[facility]  This Gastroenterology Pre-Precedure Review Form is being routed to the following provider(s): Walden Field NP

## 2018-01-18 NOTE — Patient Instructions (Signed)
Antonio Houston  [birth year] MRN: [id number]     Procedure Date: 03/28/18 Time to register: 9:30am Place to register: [facility] Procedure Time: 10:30am Scheduled provider: Barney Drain, MD    PREPARATION FOR COLONOSCOPY WITH SUPREP BOWEL PREP KIT  Note: Suprep Bowel Prep Kit is a split-dose (2day) regimen. Consumption of BOTH 6-ounce bottles is required for a complete prep.  Please notify us immediately if you are diabetic, take iron supplements, or if you are on Coumadin or any other blood thinners.   1 DAY BEFORE PROCEDURE:  DATE: 03/27/18   DAY: Sunday  clear liquids the entire day - NO SOLID FOOD.   At 6:00pm: Complete steps 1 through 4 below, using ONE (1) 6-ounce bottle, before going to bed. Step 1:  Pour ONE (1) 6-ounce bottle of SUPREP liquid into the mixing container.  Step 2:  Add cool drinking water to the 16 ounce line on the container and mix.  Note: Dilute the solution concentrate as directed prior to use. Step 3:  DRINK ALL the liquid in the container. Step 4:  You MUST drink an additional two (2) or more 16 ounce containers of water over the next one (1) hour.   Continue clear liquids.  DAY OF PROCEDURE:   DATE: 03/28/18   DAY: Monday If you take medications for your heart, blood pressure, or breathing, you may take these medications.    5 hours before your procedure at :5:30am Step 1:  Pour ONE (1) 6-ounce bottle of SUPREP liquid into the mixing container.  Step 2:  Add cool drinking water to the 16 ounce line on the container and mix.  Note: Dilute the solution concentrate as directed prior to use. Step 3:  DRINK ALL the liquid in the container. Step 4:  You MUST drink an additional two (2) or more 16 ounce containers of water over the next one (1) hour. You MUST complete the final glass of water at least 3 hours before your colonoscopy.   Nothing by mouth past :7:30am  You may take your morning medications with sip of water unless we have  instructed otherwise.    Please see below for Dietary Information.  CLEAR LIQUIDS INCLUDE:  Water Jello (NOT red in color)   Ice Popsicles (NOT red in color)   Tea (sugar ok, no milk/cream) Powdered fruit flavored drinks  Coffee (sugar ok, no milk/cream) Gatorade/ Lemonade/ Kool-Aid  (NOT red in color)   Juice: apple, white grape, white cranberry Soft drinks  Clear bullion, consomme, broth (fat free beef/chicken/vegetable)  Carbonated beverages (any kind)  Strained chicken noodle soup Hard Candy   Remember: Clear liquids are liquids that will allow you to see your fingers on the other side of a clear glass. Be sure liquids are NOT red in color, and not cloudy, but CLEAR.  DO NOT EAT OR DRINK ANY OF THE FOLLOWING:  Dairy products of any kind   Cranberry juice Tomato juice / V8 juice   Grapefruit juice Orange juice     Red grape juice  Do not eat any solid foods, including such foods as: cereal, oatmeal, yogurt, fruits, vegetables, creamed soups, eggs, bread, crackers, pureed foods in a blender, etc.   HELPFUL HINTS FOR DRINKING PREP SOLUTION:   Make sure prep is extremely cold. Mix and refrigerate the the morning of the prep. You may also put in the freezer.   You may try mixing some Crystal Light or Country Time Lemonade if you prefer. Mix  in small amounts; add more if necessary.  Try drinking through a straw  Rinse mouth with water or a mouthwash between glasses, to remove after-taste.  Try sipping on a cold beverage /ice/ popsicles between glasses of prep.  Place a piece of sugar-free hard candy in mouth between glasses.  If you become nauseated, try consuming smaller amounts, or stretch out the time between glasses. Stop for 30-60 minutes, then slowly start back drinking.     OTHER INSTRUCTIONS  You will need a responsible adult at least 63 years of age to accompany you and drive you home. This person must remain in the waiting room during your procedure. The hospital  will cancel your procedure if you do not have a responsible adult with you.   1. Wear loose fitting clothing that is easily removed. 2. Leave jewelry and other valuables at home.  3. Remove all body piercing jewelry and leave at home. 4. Total time from sign-in until discharge is approximately 2-3 hours. 5. You should go home directly after your procedure and rest. You can resume normal activities the day after your procedure. 6. The day of your procedure you should not:  Drive  Make legal decisions  Operate machinery  Drink alcohol  Return to work   You may call the office (Dept: [phone]) before 5:00pm, or page the doctor on call [phone]) after 5:00pm, for further instructions, if necessary.   Insurance Information YOU WILL NEED TO CHECK WITH YOUR INSURANCE COMPANY FOR THE BENEFITS OF COVERAGE YOU HAVE FOR THIS PROCEDURE.  UNFORTUNATELY, NOT ALL INSURANCE COMPANIES HAVE BENEFITS TO COVER ALL OR PART OF THESE TYPES OF PROCEDURES.  IT IS YOUR RESPONSIBILITY TO CHECK YOUR BENEFITS, HOWEVER, WE WILL BE GLAD TO ASSIST YOU WITH ANY CODES YOUR INSURANCE COMPANY MAY NEED.    PLEASE NOTE THAT MOST INSURANCE COMPANIES WILL NOT COVER A SCREENING COLONOSCOPY FOR PEOPLE UNDER THE AGE OF 63  IF YOU HAVE BCBS INSURANCE, YOU MAY HAVE BENEFITS FOR A SCREENING COLONOSCOPY BUT IF POLYPS ARE FOUND THE DIAGNOSIS WILL CHANGE AND THEN YOU MAY HAVE A DEDUCTIBLE THAT WILL NEED TO BE MET. SO PLEASE MAKE SURE YOU CHECK YOUR BENEFITS FOR A SCREENING COLONOSCOPY AS WELL AS A DIAGNOSTIC COLONOSCOPY.

## 2018-01-18 NOTE — Progress Notes (Signed)
Ok to schedule.  Please have the patient bring his CPAP settings to endoscopy with him.

## 2018-02-04 ENCOUNTER — Ambulatory Visit (INDEPENDENT_AMBULATORY_CARE_PROVIDER_SITE_OTHER): Payer: 59 | Admitting: Allergy & Immunology

## 2018-02-04 ENCOUNTER — Encounter: Payer: Self-pay | Admitting: Allergy & Immunology

## 2018-02-04 VITALS — BP 148/92 | HR 65 | Temp 98.4°F | Resp 20 | Ht 70.04 in | Wt 217.2 lb

## 2018-02-04 DIAGNOSIS — T7800XD Anaphylactic reaction due to unspecified food, subsequent encounter: Secondary | ICD-10-CM

## 2018-02-04 MED ORDER — EPINEPHRINE 0.3 MG/0.3ML IJ SOAJ: 0.3000 mg | Freq: Once | INTRAMUSCULAR | 2 refills | Status: AC

## 2018-02-04 NOTE — Patient Instructions (Addendum)
1. Anaphylactic shock due to food - Testing was positive to: Peanut, Walnut, Hazelnut and Pistachio - Continue to avoid peanuts and tree nuts.   - Testing was negative to nutmeg and oat. - There is a the low positive predictive value of food allergy testing and hence the high possibility of false positives. - In contrast, food allergy testing has a high negative predictive value, therefore if testing is negative we can be relatively assured that they are indeed negative.  - We will get some additional testing: alpha-gal panel to look for red meat sensitivity, serum tryptase to look for mast cell activity, and a lupin IgE to look for a lupin allergy (this is an emerging grain allergy, as this is often added to various breads as a filler).  - We will call you in 1-2 weeks with the results of the testing.  - Continue to note any triggering foods and we can use that to determine future workup.  - Training for epinephrine auto-injectors provided: EpiPen  2. Return in about 6 months (around 08/06/2018).    Please inform us of any Emergency Department visits, hospitalizations, or changes in symptoms. Call us before going to the ED for breathing or allergy symptoms since we might be able to fit you in for a sick visit. Feel free to contact us anytime with any questions, problems, or concerns.  It was a pleasure to meet you today!  Websites that have reliable patient information: 1. American Academy of Asthma, Allergy, and Immunology: www.aaaai.org 2. Food Allergy Research and Education (FARE): foodallergy.org 3. Mothers of Asthmatics: http://www.asthmacommunitynetwork.org 4. American College of Allergy, Asthma, and Immunology: MonthlyElectricBill.co.uk   Make sure you are registered to vote! If you have moved or changed any of your contact information, you will need to get this updated before voting!

## 2018-02-04 NOTE — Progress Notes (Signed)
NEW PATIENT  Date of Service/Encounter:  02/04/18  Referring provider: Franne Forts, MD   Assessment:   Anaphylactic shock due to food (peanuts, tree nuts) - with no clear trigger for the most recent reaction   Mr. 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  He has had several episodes over the last 8 years since his initial diagnosis.  The majority of them were related to cross-contamination episodes.  However, the most recent one had no clear trigger.  We did do selected testing today with food allergens, and unfortunately this was unrevealing.  We are going to get some labs to rule out a mast cell disease.  We are also going to look for an alpha gal allergy with blood work.  I also considered a lupin allergy, which is a grain that is added to many store-bought breads.  We will test for this with an IgE sent to [facility].  He does have an epinephrine autoinjector on hand.  In fact, his EpiPens, with a $0 co-pay.  There is always the possibility that this was a simple cross-contamination episode, but he ate his entire meal at home and used foods which had previously been safe for him.  He is very careful about reading labels given his sensitivity to peanuts and tree nuts.  Plan/Recommendations:   1. Anaphylactic shock due to food - Testing was positive to: Peanut, Walnut, Hazelnut and Pistachio - Continue to avoid peanuts and tree nuts.   - Testing was negative to nutmeg and oat. - There is a the low positive predictive value of food allergy testing and hence the high possibility of false positives. - In contrast, food allergy testing has a high negative predictive value, therefore if testing is negative we can be relatively assured that they are indeed negative.  - We will get some additional testing: alpha-gal panel to look for red meat sensitivity, serum tryptase to look for mast cell activity, and a lupin IgE to look for a lupin allergy (this is an  emerging grain allergy, as this is often added to various breads as a filler).  - We will call you in 1-2 weeks with the results of the testing.  - Continue to note any triggering foods and we can use that to determine future workup.  - Training for epinephrine auto-injectors provided: EpiPen  2. Return in about 6 months (around 08/06/2018).   Subjective:   Antonio Houston is a 62 y.o. male presenting today for evaluation of  Chief Complaint  Patient presents with  . Allergic Reaction    anaphylactic 5 times this year    Antonio Houston has a history of the following: There are no active problems to display for this patient.   History obtained from: chart review and patient.  Shelia Media was referred by Franne Forts, MD.     Antonio Houston is a 62 y.o. male presenting for an evaluation of food allergies. He has a known peanut and tree nut allergy, but this was not triggered by either of these, per the patient. His first reaction was 8 years ago. He has had two this year.   First episode (8 years ago): He ate [employer]. It was processed with peanuts and tree nuts. He quite breathing for 90 seconds. He went to see a specialist in [place]. He underwent testing that demonstrated positive to peanuts and tree nuts as well as a couple of other  things. He saw [facility].   Second episode: He was in [place] and he ate processed bread that was cross contaminated. He went to the hospital and received epinephrine and other medications. He actually received three doses of epinephrine here.   Third episode: He ate BBQ sauce that was made in a plant that processed peanuts and tree nuts. He went to the ED and received epinephrine.   Fourth episode (2018): He does not remember the details of the situation.   Fifth episode (one month ago): He was eating at home. He had eaten a hot dog with a bun as well as chili and mustard and slow and [place] fries. He had double  checked the bun and knew that it was not processed with peanuts and tree nuts. He ate a Benadryl and called 911 when there was no improvement. He did have a flour tortilla that morning, which was several hours before.  Typically this is a two hour window after eating. It starts with itching on his hands as well as swelling and he breaks out in hives. Interestingly, he does not have an oral involvement. He does have some trouble breathing due to throat swelling. He denies stomach pain and vomiting. He has passed out before. He notices that the hives start "anywhere you can sweat" before spreading to the rest of the body.  Medications have been stable. He tolerates all of the other food allergens besides peanuts and tree nuts. He does eat red meat around three times per week. He knows that this occurs with whatever he eats.   He has no history of environmental allergie,s eczema, or asthma. 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 She has done this for 8 years. 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   Otherwise, there is no history of other atopic diseases, including asthma, drug allergies, environmental allergies, stinging insect allergies, eczema or urticaria. There is no significant infectious history. Vaccinations are up to date.    Past Medical History: There are no active problems to display for this patient.   Medication List:  Allergies as of 02/04/2018      Reactions   Peanut Oil Anaphylaxis   Allergic to all nuts All nuts   Peanut-containing Drug Products Anaphylaxis   Bee Venom    Codeine Swelling, Rash, Hives      Medication List        Accurate as of 02/04/18 12:33 PM. Always use your most recent med list.          aspirin EC 81 MG tablet Take 81 mg by mouth every morning.   EPINEPHrine 0.3 mg/0.3 mL Soaj injection Commonly known  as:  EPI-PEN Inject into the muscle.   EPINEPHrine 0.3 mg/0.3 mL Soaj injection Commonly known as:  EPI-PEN Inject 0.3 mLs (0.3 mg total) into the muscle once for 1 dose. As directed for life-threatening allergic reactions   nebivolol 5 MG tablet Commonly known as:  BYSTOLIC Take 1 tablet (5 mg total) by mouth every morning.   OMEGA-3 EPA FISH OIL PO Take by mouth.   omeprazole 20 MG capsule Commonly known as:  PRILOSEC Take 20 mg by mouth daily.   simvastatin 80 MG tablet Commonly known as:  ZOCOR Take 80 mg by mouth at bedtime.   telmisartan 80 MG tablet Commonly known as:  MICARDIS Take 80 mg by mouth every morning.   VITAMIN D (CHOLECALCIFEROL) PO Take by mouth.       Birth History: non-contributory  Developmental History: non-contributory.   Past Surgical History: Past Surgical History:  Procedure Laterality Date  . TONSILLECTOMY       Family History: Family History  Problem Relation Age of Onset  . Diabetes Mother   . Diabetes Father   . Allergic rhinitis Neg Hx   . Asthma Neg Hx   . Eczema Neg Hx   . Urticaria Neg Hx      Social History: Abdirahim lives at home with his wife. He does have a daughter who lives down the road. He has a three year grandson. He lives in a house that is 101 years old.  There is hardwood in the main living areas and carpeting in the bedroom.  He has gas heating and central cooling.  Occasionally there is a grand dog in the bedrooms, but otherwise they have no permanent pets.  He does have dust mite covers on his bed, but not his pillows.  There is no tobacco exposure.    Review of Systems: a 14-point review of systems is pertinent for what is mentioned in HPI.  Otherwise, all other systems were negative. Constitutional: negative other than that listed in the HPI Eyes: negative other than that listed in the HPI Ears, nose, mouth, throat, and face: negative other than that listed in the HPI Respiratory: negative other than  that listed in the HPI Cardiovascular: negative other than that listed in the HPI Gastrointestinal: negative other than that listed in the HPI Genitourinary: negative other than that listed in the HPI Integument: negative other than that listed in the HPI Hematologic: negative other than that listed in the HPI Musculoskeletal: negative other than that listed in the HPI Neurological: negative other than that listed in the HPI Allergy/Immunologic: negative other than that listed in the HPI    Objective:   Blood pressure (!) 148/92, pulse 65, temperature 98.4 F (36.9 C), temperature source Oral, resp. rate 20, height 5' 10.04" (1.779 m), weight 217 lb 3.2 oz (98.5 kg), SpO2 96 %. Body mass index is 31.13 kg/m.   Physical Exam:  General: Alert, interactive, in no acute distress. Pleasant male.  Eyes: No conjunctival injection bilaterally, no discharge on the right, no discharge on the left and no Horner-Trantas dots present. PERRL bilaterally. EOMI without pain. No photophobia.  Ears: Right TM pearly gray with normal light reflex, Left TM pearly gray with normal light reflex, Right TM intact without perforation and Left TM intact without perforation.  Nose/Throat: External nose within normal limits and septum midline. Turbinates edematous and pale with clear discharge. Posterior oropharynx mildly erythematous without cobblestoning in the posterior oropharynx. Tonsils 2+ without exudates.  Tongue without thrush. Neck: Supple without thyromegaly. Trachea midline. Adenopathy: no enlarged lymph nodes appreciated in the anterior cervical, occipital, axillary, epitrochlear, inguinal, or popliteal regions. Lungs: Clear to auscultation without wheezing, rhonchi or rales. No increased work of breathing. CV: Normal S1/S2. No murmurs. Capillary refill <2 seconds.  Abdomen: Nondistended, nontender. No guarding or rebound tenderness. Bowel sounds faint and present in all fields  Skin: Warm and dry,  without lesions or rashes. Extremities:  No clubbing, cyanosis or edema. Neuro:   Grossly intact. No focal deficits appreciated. Responsive to questions.  Diagnostic studies:     Allergy Studies:   Food Adult Perc - 02/04/18 0900    Time Antigen Placed  [place]    Location  Back    Number of allergen test  8     Control-buffer 50% Glycerol  Negative    1. Peanut  3+   9x15   12. [place]  2+   3x7   13. Almond  Negative    14. Hazelnut  2+   5x10   15. [place] nut  Negative    17. Pistachio  2+   3x5   31. Oat   Negative    68. Nutmeg  Negative        Allergy testing results were read and interpreted by myself, documented by clinical staff.       Salvatore Marvel, MD [facility]

## 2018-02-06 LAB — TRYPTASE: Tryptase: 4.3 ug/L (ref 2.2–13.2)

## 2018-02-08 LAB — ALLERGEN BARLEY F6

## 2018-02-09 LAB — ALPHA GAL IGE

## 2018-02-12 ENCOUNTER — Other Ambulatory Visit: Payer: Self-pay

## 2018-02-12 ENCOUNTER — Emergency Department (HOSPITAL_COMMUNITY)
Admission: EM | Admit: 2018-02-12 | Discharge: 2018-02-13 | Disposition: A | Discharge status: Home / Self Care | Payer: 59 | Attending: Emergency Medicine | Admitting: Emergency Medicine

## 2018-02-12 ENCOUNTER — Encounter (HOSPITAL_COMMUNITY): Payer: Self-pay | Admitting: Emergency Medicine

## 2018-02-12 DIAGNOSIS — Z79899 Other long term (current) drug therapy: Secondary | ICD-10-CM | POA: Diagnosis not present

## 2018-02-12 DIAGNOSIS — Z7982 Long term (current) use of aspirin: Secondary | ICD-10-CM | POA: Insufficient documentation

## 2018-02-12 DIAGNOSIS — L509 Urticaria, unspecified: Secondary | ICD-10-CM | POA: Insufficient documentation

## 2018-02-12 DIAGNOSIS — I10 Essential (primary) hypertension: Secondary | ICD-10-CM | POA: Insufficient documentation

## 2018-02-12 DIAGNOSIS — Z91010 Allergy to peanuts: Secondary | ICD-10-CM | POA: Diagnosis not present

## 2018-02-12 DIAGNOSIS — T7840XA Allergy, unspecified, initial encounter: Secondary | ICD-10-CM | POA: Diagnosis not present

## 2018-02-12 DIAGNOSIS — R21 Rash and other nonspecific skin eruption: Secondary | ICD-10-CM | POA: Diagnosis present

## 2018-02-12 MED ORDER — EPINEPHRINE 0.3 MG/0.3ML IJ SOAJ
INTRAMUSCULAR | Status: AC
  Filled 2018-02-12: qty 0.3

## 2018-02-12 MED ORDER — METHYLPREDNISOLONE SODIUM SUCC 125 MG IJ SOLR
125.0000 mg | Freq: Once | INTRAMUSCULAR | Status: AC
  Administered 2018-02-12: 125 mg via INTRAVENOUS
  Filled 2018-02-12: qty 2

## 2018-02-12 MED ORDER — DIPHENHYDRAMINE HCL 50 MG/ML IJ SOLN
25.0000 mg | Freq: Once | INTRAMUSCULAR | Status: AC
  Administered 2018-02-12: 25 mg via INTRAVENOUS
  Filled 2018-02-12: qty 1

## 2018-02-12 MED ORDER — EPINEPHRINE 0.3 MG/0.3ML IJ SOAJ
0.3000 mg | Freq: Once | INTRAMUSCULAR | Status: AC
  Administered 2018-02-12: 0.3 mg via INTRAMUSCULAR

## 2018-02-12 MED ORDER — FAMOTIDINE IN NACL 20-0.9 MG/50ML-% IV SOLN
20.0000 mg | Freq: Once | INTRAVENOUS | Status: AC
  Administered 2018-02-12: 20 mg via INTRAVENOUS
  Filled 2018-02-12: qty 50

## 2018-02-12 NOTE — ED Triage Notes (Signed)
Pt reports having an allergic reaction that began around 2230. Pt has taken 50mg  benadryl and 1 epi pen. Pt states his underarms, hands, and feet were itching. Pt states that his lips fee like they are becoming numb.

## 2018-02-12 NOTE — ED Provider Notes (Signed)
[facility] EMERGENCY DEPARTMENT Provider Note   CSN: [id number] Arrival date & time: 02/12/18  2310     History   Chief Complaint Chief Complaint  Patient presents with  . Allergic Reaction    HPI Antonio Houston is a 62 y.o. male.  The history is provided by the patient and the spouse.  Allergic Reaction  Presenting symptoms: difficulty swallowing, itching and rash   Presenting symptoms: no wheezing   Severity:  Moderate Prior allergic episodes:  Food/nut allergies Relieved by:  Antihistamines and epinephrine Worsened by:  Nothing Patient with history of hypertension and extensive history of allergic reactions presents with possible allergic reaction.  He reports about an hour ago he had onset of itching and rash throughout his body.  He reports some numbness in his lips.  His wife administered him EpiPen, given Benadryl and then he came to the ER.  He has had multiple similar episodes in the past. He had recent allergy evaluation, but it is unclear what is causing his new reactions. This is similar to  prior episodes  Past Medical History:  Diagnosis Date  . 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 Hypertension   . Sleep apnea   . Urticaria     There are no active problems to display for this patient.   Past Surgical History:  Procedure Laterality Date  . TONSILLECTOMY          Home Medications    Prior to Admission medications   Medication Sig Start Date End Date Taking? Authorizing Provider  aspirin EC 81 MG tablet Take 81 mg by mouth every morning.    [provider]  EPINEPHrine 0.3 mg/0.3 mL IJ SOAJ injection Inject into the muscle. 03/25/12   [provider]  nebivolol (BYSTOLIC) 5 MG tablet Take 1 tablet (5 mg total) by mouth every morning. 12/16/15   Davonna Belling, MD  Omega-3 Fatty Acids (OMEGA-3 EPA FISH OIL PO) Take by mouth.    [provider]  omeprazole (PRILOSEC) 20 MG capsule Take 20 mg by mouth daily.    [provider]  simvastatin (ZOCOR) 80 MG tablet Take 80 mg by mouth at bedtime.    [provider]  telmisartan (MICARDIS) 80 MG tablet Take 80 mg by mouth every morning.     [provider]  VITAMIN D, CHOLECALCIFEROL, PO Take by mouth.    [provider]    Family History Family History  Problem Relation Age of Onset  . Diabetes Mother   . Diabetes Father   . Allergic rhinitis Neg Hx   . Asthma Neg Hx   . Eczema Neg Hx   . Urticaria Neg Hx     Social History Social History   Tobacco Use  . Smoking status: Never Smoker  . Smokeless tobacco: Never Used  Substance Use Topics  . Alcohol use: Yes    Comment: Occasional  . Drug use: No     Allergies   Peanut oil; Peanut-containing drug products; Bee venom; and Codeine   Review of Systems Review of Systems  Constitutional: Negative for fever.  HENT: Positive for trouble swallowing.   Respiratory: Negative for wheezing.   Gastrointestinal: Negative for diarrhea and vomiting.  Skin: Positive for itching and rash.  Neurological: Negative for syncope.  All other systems reviewed and are negative.    Physical Exam Updated Vital Signs BP (!) 156/79 (BP Location: Left Arm)   Pulse 86   Temp 98.9 F (37.2 C) (  Oral)   Resp 19   Ht 1.803 m (5\' 11" )   Wt 96.2 kg   SpO2 95%   BMI 29.57 kg/m   Physical Exam CONSTITUTIONAL: Well developed/well nourished HEAD: Normocephalic/atraumatic EYES: EOMI/PERRL ENMT: Mucous membranes moist, no angioedema, uvula midline without edema, voice normal, no stridor NECK: supple no meningeal signs SPINE/BACK:entire spine nontender CV: S1/S2 noted, no murmurs/rubs/gallops noted LUNGS: Lungs are clear to auscultation bilaterally, no apparent distress ABDOMEN: soft, nontender, no rebound or guarding, bowel sounds noted throughout abdomen GU:no cva tenderness NEURO: Pt is awake/alert/appropriate, moves all extremitiesx4.  No facial droop.   EXTREMITIES: pulses  normal/equal, full ROM SKIN: warm, color normal, urticaria noted to chest PSYCH: no abnormalities of mood noted, alert and oriented to situation   ED Treatments / Results  Labs (all labs ordered are listed, but only abnormal results are displayed) Labs Reviewed - No data to display  EKG None  Radiology No results found.  Procedures Procedures   Medications Ordered in ED Medications  methylPREDNISolone sodium succinate (SOLU-MEDROL) 125 mg/2 mL injection 125 mg (125 mg Intravenous Given 02/12/18 2337)  famotidine (PEPCID) IVPB 20 mg premix (0 mg Intravenous Stopped 02/13/18 0011)  diphenhydrAMINE (BENADRYL) injection 25 mg (25 mg Intravenous Given 02/12/18 2336)  EPINEPHrine (EPI-PEN) injection 0.3 mg (0.3 mg Intramuscular Given 02/12/18 2334)     Initial Impression / Assessment and Plan / ED Course  I have reviewed the triage vital signs and the nursing notes.       11:46 PM Patient presented with recurrent allergic reaction.  He had received EpiPen and was feeling improved at home, but soon after I left the room he reports his lip started to tingle and he requested EpiPen.  This was reordered for patient 12:56 AM Patient is improved taking p.o. Fluids    After monitoring in the ER patient felt improved and was requesting discharge.  He feels very comfortable monitoring this at home.  He has EpiPen's at home.  This seems reasonable, will discharge home. Final Clinical Impressions(s) / ED Diagnoses   Final diagnoses:  Allergic reaction, initial encounter  [place]    ED Discharge Orders         Ordered    predniSONE (DELTASONE) 50 MG tablet     02/13/18 3817           Ripley Fraise, MD 02/13/18 0205

## 2018-02-13 MED ORDER — PREDNISONE 50 MG PO TABS: ORAL_TABLET | ORAL | 0 refills | Status: DC

## 2018-03-08 ENCOUNTER — Encounter (HOSPITAL_COMMUNITY): Payer: Self-pay | Admitting: Emergency Medicine

## 2018-03-08 ENCOUNTER — Emergency Department (HOSPITAL_COMMUNITY)
Admission: EM | Admit: 2018-03-08 | Discharge: 2018-03-08 | Disposition: A | Discharge status: Home / Self Care | Payer: 59 | Attending: Emergency Medicine | Admitting: Emergency Medicine

## 2018-03-08 ENCOUNTER — Other Ambulatory Visit: Payer: Self-pay

## 2018-03-08 DIAGNOSIS — I10 Essential (primary) hypertension: Secondary | ICD-10-CM | POA: Insufficient documentation

## 2018-03-08 DIAGNOSIS — Z91010 Allergy to peanuts: Secondary | ICD-10-CM | POA: Insufficient documentation

## 2018-03-08 DIAGNOSIS — T7840XA Allergy, unspecified, initial encounter: Secondary | ICD-10-CM | POA: Diagnosis not present

## 2018-03-08 DIAGNOSIS — Z7982 Long term (current) use of aspirin: Secondary | ICD-10-CM | POA: Diagnosis not present

## 2018-03-08 DIAGNOSIS — L299 Pruritus, unspecified: Secondary | ICD-10-CM | POA: Diagnosis present

## 2018-03-08 DIAGNOSIS — Z79899 Other long term (current) drug therapy: Secondary | ICD-10-CM | POA: Diagnosis not present

## 2018-03-08 MED ORDER — PREDNISONE 20 MG PO TABS: 40.0000 mg | ORAL_TABLET | Freq: Every day | ORAL | 0 refills | Status: DC

## 2018-03-08 MED ORDER — PREDNISONE 20 MG PO TABS
40.0000 mg | ORAL_TABLET | Freq: Once | ORAL | Status: AC
  Administered 2018-03-08: 40 mg via ORAL
  Filled 2018-03-08: qty 2

## 2018-03-08 MED ORDER — EPINEPHRINE 0.3 MG/0.3ML IJ SOAJ: 0.3000 mg | Freq: Once | INTRAMUSCULAR | 1 refills | Status: DC | PRN

## 2018-03-08 NOTE — Discharge Instructions (Signed)
You have been diagnosed with an allergic reaction. Usually allergic reactions like this are caused by exposures to something that you either ate or touched or smelled.  It may be related to a number of different exposures including a new perfume, topical creams, soaps, detergents, linens, clothing, medications. Occasionally we do not find an answer for why there is an allergic reaction. These are treated the same way including Benadryl as needed for itching and rash. (This can be used up to 50 mg every 6 hours as needed).  Pepcid 20 mg every night and prednisone once a day for 5 days. Please do not take the Benadryl and drive or take care of children or other imported duties as the Benadryl can make you sleepy.    If you should develop severe or worsening symptoms including difficulty breathing, difficulty swallowing, wheezing or increased coughing or a rash that developed on the inside of your mouth or a worsening rash on your skin, return to the hospital immediately for a recheck. Please call your Dr. in the morning for a recheck in 2 days if you are still having symptoms. If you do not have a Dr. see the list below.  If we have identified the source of your allergic reaction, please avoid this at all costs. This means stopping the medication if it is a new medication or a voiding topical exposures such as creams lotions body soaps or deodorants if this is the source.  Allergic Reaction, Mild to Moderate Allergies may happen from anything your body is sensitive to. This may be food, medications, pollens, chemicals, and nearly anything around you in everyday life that produces allergens. An allergen is anything that causes an allergy producing substance. Allergens cause your body to release allergic antibodies. Through a chain of events, they cause a release of histamine into the blood stream. Histamines are meant to protect you, but they also cause your discomfort. This is why antihistamines are often used  for allergies. Heredity is often a factor in causing allergic reactions. This means you may have some of the same allergies as your parents. Allergies happen in all age groups. You may have some idea of what caused your reaction. There are many allergens around us. It may be difficult to know what caused your reaction. If this is a first time event, it may never happen again. Allergies cannot be cured but can be controlled with medications. SYMPTOMS  You may get some or all of the following problems from allergies. Swelling and itching in and around the mouth.  Tearing, itchy eyes.  Nasal congestion and runny nose.  Sneezing and coughing.  An itchy red rash or hives.  Vomiting or diarrhea.  Difficulty breathing.  Seasonal allergies occur in all age groups. They are seasonal because they usually occur during the same season every year. They may be a reaction to molds, grass pollens, or tree pollens. Other causes of allergies are house dust mite allergens, pet dander and mold spores. These are just a common few of the thousands of allergens around us. All of the symptoms listed above happen when you come in contact with pollens and other allergens. Seasonal allergies are usually not life threatening. They are generally more of a nuisance that can often be handled using medications. Hay fever is a combination of all or some of the above listed allergy problems. It may often be treated with simple over-the-counter medications such as diphenhydramine. Take medication as directed. Check with your caregiver or package   insert for child dosages. TREATMENT AND HOME CARE INSTRUCTIONS If hives or rash are present: Take medications as directed.  You may use an over-the-counter antihistamine (diphenhydramine) for hives and itching as needed. Do not drive or drink alcohol until medications used to treat the reaction have worn off. Antihistamines tend to make people sleepy.  Apply cold cloths (compresses) to the  skin or take baths in cool water. This will help itching. Avoid hot baths or showers. Heat will make a rash and itching worse.  If your allergies persist and become more severe, and over the counter medications are not effective, there are many new medications your caretaker can prescribe. Immunotherapy or desensitizing injections can be used if all else fails. Follow up with your caregiver if problems continue.  SEEK MEDICAL CARE IF:  Your allergies are becoming progressively more troublesome.  You suspect a food allergy. Symptoms generally happen within 30 minutes of eating a food.  Your symptoms have not gone away within 2 days or are getting worse.  You develop new symptoms.  You want to retest yourself or your child with a food or drink you think causes an allergic reaction. Never test yourself or your child of a suspected allergy without being under the watchful eye of your caregivers. A second exposure to an allergen may be life-threatening.  SEEK IMMEDIATE MEDICAL CARE IF: You develop difficulty breathing or wheezing, or have a tight feeling in your chest or throat.  You develop a swollen mouth, hives, swelling, or itching all over your body.  A severe reaction with any of the above problems should be considered life-threatening. If you suddenly develop difficulty breathing call for local emergency medical help. THIS IS AN EMERGENCY. MAKE SURE YOU:  Understand these instructions.  Will watch your condition.  Will get help right away if you are not doing well or get worse.  Document Released: 02/01/2007 Document Revised: 03/26/2011 Document Reviewed: 02/01/2007 Exit[employer] Patient Information 2012 [employer].      

## 2018-03-08 NOTE — ED Notes (Signed)
Pt states decrease in sx at this time.

## 2018-03-08 NOTE — ED Triage Notes (Addendum)
Pt states he ate something with peanuts in it today, unaware of what it was.  Pt c/o of hives and numbness in hands and lips.  Denies sob. Two two benadryl and EPI pen 30 mins ago

## 2018-03-08 NOTE — ED Provider Notes (Signed)
[facility] EMERGENCY DEPARTMENT Provider Note   CSN: [id number] Arrival date & time: 03/08/18  1853     History   Chief Complaint Chief Complaint  Patient presents with  . Allergic Reaction    HPI Antonio Houston is a 62 y.o. male.  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  This evening the patient had acute onset of itching in his bilateral axilla which is the red flag for him, he did not have any difficulty breathing but knows that it progresses rapidly so he gave himself an EpiPen and some Benadryl which at this point is significantly improved and almost resolved the symptoms.  He does have mild periorbital swelling according to his significant other, he has no nausea vomiting or diarrhea, no abdominal pain chest pain coughing or shortness of breath and denies any symptoms in his throat.  Symptoms are mild at this time, improved after medicines given prehospital.  He has had multiple episodes of allergy testing in the office in the past and other than his known allergies there is been no new contaminants or exposures.  Past Medical History:  Diagnosis Date  . 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 Hypertension   . Sleep apnea   . Urticaria     There are no active problems to display for this patient.   Past Surgical History:  Procedure Laterality Date  . TONSILLECTOMY          Home Medications    Prior to Admission medications   Medication Sig Start Date End Date Taking? Authorizing Provider  aspirin EC 81 MG tablet Take 81 mg by mouth every morning.    [provider]  EPINEPHrine (EPIPEN 2-PAK) 0.3 mg/0.3 mL IJ SOAJ injection Inject 0.3 mLs (0.3 mg total) into the muscle once as needed (for severe allergic reaction). CAll 911 immediately if you have to use this medicine 03/08/18   Noemi Chapel, MD  nebivolol (BYSTOLIC) 5 MG tablet Take 1 tablet (5 mg total) by mouth every morning. 12/16/15   Davonna Belling, MD  Omega-3 Fatty Acids (OMEGA-3 EPA FISH OIL PO) Take by mouth.    [provider]  omeprazole (PRILOSEC) 20 MG capsule Take 20 mg by mouth daily.    [provider]  predniSONE (DELTASONE) 20 MG tablet Take 2 tablets (40 mg total) by mouth daily. 03/08/18   Noemi Chapel, MD  simvastatin (ZOCOR) 80 MG tablet Take 80 mg by mouth at bedtime.    [provider]  telmisartan (MICARDIS) 80 MG tablet Take 80 mg by mouth every morning.     [provider]  VITAMIN D, CHOLECALCIFEROL, PO Take by mouth.    [provider]    Family History Family History  Problem Relation Age of Onset  . Diabetes Mother   . Diabetes Father   . Allergic rhinitis Neg Hx   . Asthma Neg Hx   . Eczema Neg Hx   . Urticaria Neg Hx     Social History Social History   Tobacco Use  . Smoking status: Never Smoker  . Smokeless tobacco: Never Used  Substance Use Topics  . Alcohol use: Yes    Comment: Occasional  . Drug use: No     Allergies   Peanut oil; Peanut-containing drug products; Bee  venom; and Codeine   Review of Systems Review of Systems  All other systems reviewed and are negative.    Physical Exam Updated Vital Signs BP 114/75 (BP Location: Right Arm)   Pulse 68   Temp 98.4 F (36.9 C) (Oral)   Resp 15   Ht 1.803 m (5\' 11" )   Wt 96.2 kg   SpO2 95%   BMI 29.57 kg/m   Physical Exam  Constitutional: He appears well-developed and well-nourished. No distress.  HENT:  Head: Normocephalic and atraumatic.  Mouth/Throat: Oropharynx is clear and moist. No oropharyngeal exudate.  Eyes: Pupils are equal, round, and reactive to light. Conjunctivae and EOM are normal. Right eye exhibits no discharge. Left eye exhibits no discharge. No scleral icterus.  Neck: Normal range of motion. Neck supple. No JVD present. No thyromegaly  present.  Cardiovascular: Normal rate, regular rhythm, normal heart sounds and intact distal pulses. Exam reveals no gallop and no friction rub.  No murmur heard. Pulmonary/Chest: Effort normal and breath sounds normal. No respiratory distress. He has no wheezes. He has no rales.  Abdominal: Soft. Bowel sounds are normal. He exhibits no distension and no mass. There is no tenderness.  Musculoskeletal: Normal range of motion. He exhibits no edema or tenderness.  Lymphadenopathy:    He has no cervical adenopathy.  Neurological: He is alert. Coordination normal.  Skin: Skin is warm and dry. No rash noted. No erythema.  Psychiatric: He has a normal mood and affect. His behavior is normal.  Nursing note and vitals reviewed.    ED Treatments / Results  Labs (all labs ordered are listed, but only abnormal results are displayed) Labs Reviewed - No data to display  EKG None  Radiology No results found.  Procedures Procedures (including critical care time)  Medications Ordered in ED Medications  predniSONE (DELTASONE) tablet 40 mg (40 mg Oral Given 03/08/18 1918)     Initial Impression / Assessment and Plan / ED Course  I have reviewed the triage vital signs and the nursing notes.  Pertinent labs & imaging results that were available during my care of the patient were reviewed by me and considered in my medical decision making (see chart for details).     At this time the patient does not have a rash, there is no urticaria, his oropharynx is clear, phonation is normal and there is no wheezing.  He will be given prednisone and short observatory.,  He knows that he needs to follow-up closely with allergy to be retested.  Improved with meds - stable at 2 hours mark with no sx Refilled epi pen and home with prednisone and ongoing H2 - pt agreeable.  Final Clinical Impressions(s) / ED Diagnoses   Final diagnoses:  Allergic reaction, initial encounter    ED Discharge Orders          Ordered    predniSONE (DELTASONE) 20 MG tablet  Daily     03/08/18 2054    EPINEPHrine (EPIPEN 2-PAK) 0.3 mg/0.3 mL IJ SOAJ injection  Once PRN     03/08/18 2054           Noemi Chapel, MD 03/08/18 2055

## 2018-03-28 ENCOUNTER — Ambulatory Visit (HOSPITAL_COMMUNITY)
Admission: RE | Admit: 2018-03-28 | Discharge: 2018-03-28 | Disposition: A | Discharge status: Home / Self Care | Payer: 59 | Source: Ambulatory Visit | Attending: Gastroenterology | Admitting: Gastroenterology

## 2018-03-28 ENCOUNTER — Other Ambulatory Visit: Payer: Self-pay

## 2018-03-28 ENCOUNTER — Encounter (HOSPITAL_COMMUNITY): Payer: Self-pay

## 2018-03-28 ENCOUNTER — Encounter (HOSPITAL_COMMUNITY)
Admission: RE | Disposition: A | Discharge status: Home / Self Care | Payer: Self-pay | Source: Ambulatory Visit | Attending: Gastroenterology

## 2018-03-28 DIAGNOSIS — D122 Benign neoplasm of ascending colon: Secondary | ICD-10-CM | POA: Diagnosis not present

## 2018-03-28 DIAGNOSIS — D128 Benign neoplasm of rectum: Secondary | ICD-10-CM | POA: Diagnosis not present

## 2018-03-28 DIAGNOSIS — D123 Benign neoplasm of transverse colon: Secondary | ICD-10-CM | POA: Insufficient documentation

## 2018-03-28 DIAGNOSIS — Z7982 Long term (current) use of aspirin: Secondary | ICD-10-CM | POA: Insufficient documentation

## 2018-03-28 DIAGNOSIS — K5730 Diverticulosis of large intestine without perforation or abscess without bleeding: Secondary | ICD-10-CM | POA: Diagnosis not present

## 2018-03-28 DIAGNOSIS — I10 Essential (primary) hypertension: Secondary | ICD-10-CM | POA: Diagnosis not present

## 2018-03-28 DIAGNOSIS — Z79899 Other long term (current) drug therapy: Secondary | ICD-10-CM | POA: Insufficient documentation

## 2018-03-28 DIAGNOSIS — Z860100 Personal history of colon polyps, unspecified: Secondary | ICD-10-CM

## 2018-03-28 DIAGNOSIS — Z885 Allergy status to narcotic agent status: Secondary | ICD-10-CM | POA: Diagnosis not present

## 2018-03-28 DIAGNOSIS — Z91030 Bee allergy status: Secondary | ICD-10-CM | POA: Insufficient documentation

## 2018-03-28 DIAGNOSIS — K648 Other hemorrhoids: Secondary | ICD-10-CM | POA: Insufficient documentation

## 2018-03-28 DIAGNOSIS — Z87892 Personal history of anaphylaxis: Secondary | ICD-10-CM | POA: Insufficient documentation

## 2018-03-28 DIAGNOSIS — Z91010 Allergy to peanuts: Secondary | ICD-10-CM | POA: Insufficient documentation

## 2018-03-28 DIAGNOSIS — G4730 Sleep apnea, unspecified: Secondary | ICD-10-CM | POA: Diagnosis not present

## 2018-03-28 HISTORY — PX: POLYPECTOMY: SHX5525

## 2018-03-28 HISTORY — PX: COLONOSCOPY: SHX5424

## 2018-03-28 SURGERY — COLONOSCOPY
Anesthesia: Moderate Sedation

## 2018-03-28 MED ORDER — SODIUM CHLORIDE 0.9 % IV SOLN
INTRAVENOUS | Status: DC
  Administered 2018-03-28: 10:00:00 via INTRAVENOUS

## 2018-03-28 MED ORDER — MIDAZOLAM HCL 5 MG/5ML IJ SOLN
INTRAMUSCULAR | Status: DC | PRN
  Administered 2018-03-28 (×3): 2 mg via INTRAVENOUS

## 2018-03-28 MED ORDER — MIDAZOLAM HCL 5 MG/5ML IJ SOLN
INTRAMUSCULAR | Status: AC
  Filled 2018-03-28: qty 10

## 2018-03-28 MED ORDER — MEPERIDINE HCL 100 MG/ML IJ SOLN
INTRAMUSCULAR | Status: AC
  Filled 2018-03-28: qty 2

## 2018-03-28 MED ORDER — MEPERIDINE HCL 100 MG/ML IJ SOLN
INTRAMUSCULAR | Status: DC | PRN
  Administered 2018-03-28 (×4): 25 mg via INTRAVENOUS

## 2018-03-28 MED ORDER — STERILE WATER FOR IRRIGATION IR SOLN
Status: DC | PRN
  Administered 2018-03-28: 100 mL

## 2018-03-28 NOTE — Op Note (Signed)
[facility] Patient Name: Antonio Houston Procedure Date: 03/28/2018 10:18 AM MRN: [id number] Date of Birth: July 10, 1955 Attending MD: Barney Drain MD, MD CSN: [id number] Age: 62 Admit Type: Outpatient Procedure:                ColonoscopyWITH COLD SNARE POLYPECTOMY Indications:              Personal history of colonic polyps Providers:                Barney Drain MD, MD, Rosina Lowenstein, RN, Randa Spike, Technician Referring MD:             AMY SAPP, MD Medicines:                Meperidine 100 mg IV, Midazolam 6 mg IV Complications:            No immediate complications. Estimated Blood Loss:     Estimated blood loss was minimal. Procedure:                Pre-Anesthesia Assessment:                           - Prior to the procedure, a History and Physical                            was performed, and patient medications and                            allergies were reviewed. The patient's tolerance of                            previous anesthesia was also reviewed. The risks                            and benefits of the procedure and the sedation                            options and risks were discussed with the patient.                            All questions were answered, and informed consent                            was obtained. Prior Anticoagulants: The patient has                            taken aspirin, last dose was day of procedure. ASA                            Grade Assessment: II - A patient with mild systemic                            disease. After reviewing the risks and benefits,  the patient was deemed in satisfactory condition to                            undergo the procedure. After obtaining informed                            consent, the colonoscope was passed under direct                            vision. Throughout the procedure, the patient's                            blood pressure, pulse,  and oxygen saturations were                            monitored continuously. The CF-HQ190L ([id number])                            scope was introduced through the anus and advanced                            to the the cecum, identified by appendiceal orifice                            and ileocecal valve. The colonoscopy was somewhat                            difficult due to a tortuous colon. Successful                            completion of the procedure was aided by                            straightening and shortening the scope to obtain                            bowel loop reduction and COLOWRAP. The patient                            tolerated the procedure well. The quality of the                            bowel preparation was excellent. The ileocecal                            valve, appendiceal orifice, and rectum were                            photographed. Scope In: 10:55:38 AM Scope Out: 11:13:10 AM Scope Withdrawal Time: 0 hours 15 minutes 51 seconds  Total Procedure Duration: 0 hours 17 minutes 32 seconds  Findings:      Four sessile polyps were found in the rectum(2), transverse colon and       ascending colon. The polyps were  3 to 8 mm in size. These polyps were       removed with a cold snare. Resection and retrieval were complete.       Coagulation for hemostasis of bleeding caused by the procedure using       snare was successful.      Multiple small and large-mouthed diverticula were found in the       recto-sigmoid colon, sigmoid colon, descending colon, splenic flexure       and distal transverse colon.      Internal hemorrhoids were found. The hemorrhoids were small. Impression:               - Four 3 to 8 mm polyps in the rectum, in the                            transverse colon and in the ascending colon,                            removed with a cold snare. Resected and retrieved.                            Treated with a hot snare.                            - Diverticulosis in the recto-sigmoid colon, in the                            sigmoid colon, in the descending colon, at the                            splenic flexure and in the distal transverse colon.                           - Internal hemorrhoids. Moderate Sedation:      Moderate (conscious) sedation was administered by the endoscopy nurse       and supervised by the endoscopist. The following parameters were       monitored: oxygen saturation, heart rate, blood pressure, and response       to care. Total physician intraservice time was 35 minutes. Recommendation:           - Patient has a contact number available for                            emergencies. The signs and symptoms of potential                            delayed complications were discussed with the                            patient. Return to normal activities tomorrow.                            Written discharge instructions were provided to the  patient.                           - High fiber diet.                           - Continue present medications.                           - Await pathology results.                           - Repeat colonoscopy in 3 years for surveillance. Procedure Code(s):        --- Professional ---                           [phone], Colonoscopy, flexible; with removal of                            tumor(s), polyp(s), or other lesion(s) by snare                            technique                           99153, Moderate sedation; each additional 15                            minutes intraservice time                           G0500, Moderate sedation services provided by the                            same physician or other qualified health care                            professional performing a gastrointestinal                            endoscopic service that sedation supports,                            requiring the presence of an  independent trained                            observer to assist in the monitoring of the                            patient's level of consciousness and physiological                            status; initial 15 minutes of intra-service time;                            patient age 56 years or older (additional time 22  be reported with [phone], as appropriate) Diagnosis Code(s):        --- Professional ---                           K62.1, Rectal polyp                           D12.3, Benign neoplasm of transverse colon (hepatic                            flexure or splenic flexure)                           D12.2, Benign neoplasm of ascending colon                           K64.8, Other hemorrhoids                           Z86.010, Personal history of colonic polyps                           K57.30, Diverticulosis of large intestine without                            perforation or abscess without bleeding CPT copyright 2018 [employer]. All rights reserved. The codes documented in this report are preliminary and upon coder review may  be revised to meet current compliance requirements. Barney Drain, MD Barney Drain MD, MD 03/28/2018 11:44:46 AM This report has been signed electronically. Number of Addenda: 0

## 2018-03-28 NOTE — H&P (Signed)
Primary Care Physician:  Franne Forts, MD Primary Gastroenterologist:  Dr. Oneida Alar  Pre-Procedure History & Physical: HPI:  Antonio Houston is a 62 y.o. male here for  PERSONAL HISTORY OF POLYPS.  Past Medical History:  Diagnosis Date  . 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 Hypertension   . Sleep apnea   . Urticaria     Past Surgical History:  Procedure Laterality Date  . TONSILLECTOMY      Prior to Admission medications   Medication Sig Start Date End Date Taking? Authorizing Provider  aspirin EC 81 MG tablet Take 81 mg by mouth every morning.   Yes [provider]  EPINEPHrine (EPIPEN 2-PAK) 0.3 mg/0.3 mL IJ SOAJ injection Inject 0.3 mLs (0.3 mg total) into the muscle once as needed (for severe allergic reaction). CAll 911 immediately if you have to use this medicine 03/08/18  Yes Noemi Chapel, MD  Melatonin 10 MG TABS Take 10 mg by mouth at bedtime.   Yes [provider]  nebivolol (BYSTOLIC) 10 MG tablet Take 5 mg by mouth daily.   Yes [provider]  Omega-3 Fatty Acids (OMEGA-3 EPA FISH OIL PO) Take 1 capsule by mouth daily.    Yes [provider]  simvastatin (ZOCOR) 80 MG tablet Take 80 mg by mouth at bedtime.   Yes [provider]  telmisartan (MICARDIS) 80 MG tablet Take 40 mg by mouth every morning.    Yes [provider]  Vitamin D, Cholecalciferol, 25 MCG (1000 UT) CAPS Take 1 capsule by mouth daily.    Yes [provider]    Allergies as of 01/18/2018 - Review Complete 01/18/2018  Allergen Reaction Noted  . Peanut-containing drug products Anaphylaxis 12/16/2015  . Bee venom  11/26/2017  . Codeine Swelling and Rash 01/18/2018    Family History  Problem Relation Age of Onset  . Diabetes Mother   . Diabetes Father   . Allergic rhinitis Neg Hx   . Asthma Neg Hx   . Eczema Neg Hx   . Urticaria Neg Hx   . Colon cancer Neg Hx   . Colon polyps Neg Hx     Social History   Socioeconomic History  .  Marital status: Married    Spouse name: Not on file  . Number of children: Not on file  . Years of education: Not on file  . Highest education level: Not on file  Occupational History  . Not on file  Social Needs  . Financial resource strain: Not on file  . Food insecurity:    Worry: Not on file    Inability: Not on file  . Transportation needs:    Medical: Not on file    Non-medical: Not on file  Tobacco Use  . Smoking status: Never Smoker  . Smokeless tobacco: Never Used  Substance and Sexual Activity  . Alcohol use: Yes    Comment: Occasional  . Drug use: No  . Sexual activity: Not on file  Lifestyle  . Physical activity:    Days per week: Not on file    Minutes per session: Not on file  . Stress: Not on file  Relationships  . Social connections:    Talks on phone: Not on file    Gets together: Not on file    Attends religious service: Not on file    Active member of club or organization: Not on file    Attends meetings of clubs or organizations: Not on file  Relationship status: Not on file  . Intimate partner violence:    Fear of current or ex partner: Not on file    Emotionally abused: Not on file    Physically abused: Not on file    Forced sexual activity: Not on file  Other Topics Concern  . Not on file  Social History Narrative  . Not on file    Review of Systems: See HPI, otherwise negative ROS   Physical Exam: BP 132/84   Pulse 66   Temp 98.3 F (36.8 C) (Oral)   Resp (!) 22   SpO2 95%  General:   Alert,  pleasant and cooperative in NAD Head:  Normocephalic and atraumatic. Neck:  Supple; Lungs:  Clear throughout to auscultation.    Heart:  Regular rate and rhythm. Abdomen:  Soft, nontender and nondistended. Normal bowel sounds, without guarding, and without rebound.   Neurologic:  Alert and  oriented x4;  grossly normal neurologically.  Impression/Plan:     PERSONAL HISTORY OF POLYPS.  PLAN: 1. TCS TODAY. DISCUSSED PROCEDURE,  BENEFITS, & RISKS: < 1% chance of medication reaction, bleeding, perforation, or rupture of spleen/liver.

## 2018-03-28 NOTE — Discharge Instructions (Signed)
You have small internal hemorrhoids and diverticulosis IN YOUR LEFT AND RIGHT COLON. YOU HAD FOUR POLYPS REMOVED.    DRINK WATER TO KEEP YOUR URINE LIGHT YELLOW.  TO KEEP YOUR BODY MASS INDEX LESS THAN 30 WHICH MEANS YOU ARE OBESE, MAINTAIN A WEIGHT OF 211 LBS OR LESS. OBESITY IS ASSOCIATED WITH AN INCREASE FOR CIRRHOSIS AND ALL CANCERS, INCLUDING ESOPHAGEAL AND COLON CANCER.  FOLLOW A HIGH FIBER DIET. AVOID ITEMS THAT CAUSE BLOATING. See info below.  YOUR BIOPSY RESULTS WILL BE BACK IN 5 BUSINESS DAYS.  USE PREPARATION H FOUR TIMES  A DAY IF NEEDED TO RELIEVE RECTAL PAIN/PRESSURE/BLEEDING.  Next colonoscopy in 3 years.  Colonoscopy Care After Read the instructions outlined below and refer to this sheet in the next week. These discharge instructions provide you with general information on caring for yourself after you leave the hospital. While your treatment has been planned according to the most current medical practices available, unavoidable complications occasionally occur. If you have any problems or questions after discharge, call DR. Darrly Loberg, [phone].  ACTIVITY  You may resume your regular activity, but move at a slower pace for the next 24 hours.   Take frequent rest periods for the next 24 hours.   Walking will help get rid of the air and reduce the bloated feeling in your belly (abdomen).   No driving for 24 hours (because of the medicine (anesthesia) used during the test).   You may shower.   Do not sign any important legal documents or operate any machinery for 24 hours (because of the anesthesia used during the test).    NUTRITION  Drink plenty of fluids.   You may resume your normal diet as instructed by your doctor.   Begin with a light meal and progress to your normal diet. Heavy or fried foods are harder to digest and may make you feel sick to your stomach (nauseated).   Avoid alcoholic beverages for 24 hours or as instructed.    MEDICATIONS  You  may resume your normal medications.   WHAT YOU CAN EXPECT TODAY  Some feelings of bloating in the abdomen.   Passage of more gas than usual.   Spotting of blood in your stool or on the toilet paper  .  IF YOU HAD POLYPS REMOVED DURING THE COLONOSCOPY:  Eat a soft diet IF YOU HAVE NAUSEA, BLOATING, ABDOMINAL PAIN, OR VOMITING.    FINDING OUT THE RESULTS OF YOUR TEST Not all test results are available during your visit. DR. Oneida Alar WILL CALL YOU WITHIN 7 DAYS OF YOUR PROCEDUE WITH YOUR RESULTS. Do not assume everything is normal if you have not heard from DR. Sylina Henion, CALL HER OFFICE AT [phone].  SEEK IMMEDIATE MEDICAL ATTENTION AND CALL THE OFFICE: [phone] IF:  You have more than a spotting of blood in your stool.   Your belly is swollen (abdominal distention).   You are nauseated or vomiting.   You have a temperature over 101F.   You have abdominal pain or discomfort that is severe or gets worse throughout the day.  High-Fiber Diet A high-fiber diet changes your normal diet to include more whole grains, legumes, fruits, and vegetables. Changes in the diet involve replacing refined carbohydrates with unrefined foods. The calorie level of the diet is essentially unchanged. The Dietary Reference Intake (recommended amount) for adult males is 38 grams per day. For adult females, it is 25 grams per day. Pregnant and lactating women should consume 28 grams of fiber per  day. Fiber is the intact part of a plant that is not broken down during digestion. Functional fiber is fiber that has been isolated from the plant to provide a beneficial effect in the body.  PURPOSE  Increase stool bulk.   Ease and regulate bowel movements.   Lower cholesterol.   REDUCE RISK OF COLON CANCER  INDICATIONS THAT YOU NEED MORE FIBER  Constipation and hemorrhoids.   Uncomplicated diverticulosis (intestine condition) and irritable bowel syndrome.   Weight management.   As a protective  measure against hardening of the arteries (atherosclerosis), diabetes, and cancer.   GUIDELINES FOR INCREASING FIBER IN THE DIET  Start adding fiber to the diet slowly. A gradual increase of about 5 more grams (2 slices of whole-wheat bread, 2 servings of most fruits or vegetables, or 1 bowl of high-fiber cereal) per day is best. Too rapid an increase in fiber may result in constipation, flatulence, and bloating.   Drink enough water and fluids to keep your urine clear or pale yellow. Water, juice, or caffeine-free drinks are recommended. Not drinking enough fluid may cause constipation.   Eat a variety of high-fiber foods rather than one type of fiber.   Try to increase your intake of fiber through using high-fiber foods rather than fiber pills or supplements that contain small amounts of fiber.   The goal is to change the types of food eaten. Do not supplement your present diet with high-fiber foods, but replace foods in your present diet.   INCLUDE A VARIETY OF FIBER SOURCES  Replace refined and processed grains with whole grains, canned fruits with fresh fruits, and incorporate other fiber sources. White rice, white breads, and most bakery goods contain little or no fiber.   Brown whole-grain rice, buckwheat oats, and many fruits and vegetables are all good sources of fiber. These include: broccoli, Brussels sprouts, cabbage, cauliflower, beets, sweet potatoes, white potatoes (skin on), carrots, tomatoes, eggplant, squash, berries, fresh fruits, and dried fruits.   Cereals appear to be the richest source of fiber. Cereal fiber is found in whole grains and bran. Bran is the fiber-rich outer coat of cereal grain, which is largely removed in refining. In whole-grain cereals, the bran remains. In breakfast cereals, the largest amount of fiber is found in those with "bran" in their names. The fiber content is sometimes indicated on the label.   You may need to include additional fruits and  vegetables each day.   In baking, for 1 cup white flour, you may use the following substitutions:   1 cup whole-wheat flour minus 2 tablespoons.   1/2 cup white flour plus 1/2 cup whole-wheat flour.   Polyps, Colon  A polyp is extra tissue that grows inside your body. Colon polyps grow in the large intestine. The large intestine, also called the colon, is part of your digestive system. It is a long, hollow tube at the end of your digestive tract where your body makes and stores stool. Most polyps are not dangerous. They are benign. This means they are not cancerous. But over time, some types of polyps can turn into cancer. Polyps that are smaller than a pea are usually not harmful. But larger polyps could someday become or may already be cancerous. To be safe, doctors remove all polyps and test them.   PREVENTION There is not one sure way to prevent polyps. You might be able to lower your risk of getting them if you:  Eat more fruits and vegetables and less fatty  food.   Do not smoke.   Avoid alcohol.   Exercise every day.   Lose weight if you are overweight.   Eating more calcium and folate can also lower your risk of getting polyps. Some foods that are rich in calcium are milk, cheese, and broccoli. Some foods that are rich in folate are chickpeas, kidney beans, and spinach.    Diverticulosis Diverticulosis is a common condition that develops when small pouches (diverticula) form in the wall of the colon. The risk of diverticulosis increases with age. It happens more often in people who eat a low-fiber diet. Most individuals with diverticulosis have no symptoms. Those individuals with symptoms usually experience belly (abdominal) pain, constipation, or loose stools (diarrhea).  HOME CARE INSTRUCTIONS  Increase the amount of fiber in your diet as directed by your caregiver or dietician. This may reduce symptoms of diverticulosis.   Drink at least 6 to 8 glasses of water each day to  prevent constipation.   Try not to strain when you have a bowel movement.   Avoiding nuts and seeds to prevent complications is NOT NECESSARY.   FOODS HAVING HIGH FIBER CONTENT INCLUDE:  Fruits. Apple, peach, pear, tangerine, raisins, prunes.   Vegetables. Brussels sprouts, asparagus, broccoli, cabbage, carrot, cauliflower, romaine lettuce, spinach, summer squash, tomato, winter squash, zucchini.   Starchy Vegetables. Baked beans, kidney beans, lima beans, split peas, lentils, potatoes (with skin).   Grains. Whole wheat bread, brown rice, bran flake cereal, plain oatmeal, white rice, shredded wheat, bran muffins.   SEEK IMMEDIATE MEDICAL CARE IF:  You develop increasing pain or severe bloating.   You have an oral temperature above 101F.   You develop vomiting or bowel movements that are bloody or black.

## 2018-04-01 ENCOUNTER — Encounter (HOSPITAL_COMMUNITY): Payer: Self-pay | Admitting: Gastroenterology

## 2018-04-04 ENCOUNTER — Encounter: Payer: Self-pay | Admitting: Pediatrics

## 2018-04-04 ENCOUNTER — Ambulatory Visit (INDEPENDENT_AMBULATORY_CARE_PROVIDER_SITE_OTHER): Payer: 59 | Admitting: Pediatrics

## 2018-04-04 VITALS — BP 142/94 | HR 60 | Temp 98.6°F | Resp 16

## 2018-04-04 DIAGNOSIS — T63481D Toxic effect of venom of other arthropod, accidental (unintentional), subsequent encounter: Secondary | ICD-10-CM

## 2018-04-04 DIAGNOSIS — Z79899 Other long term (current) drug therapy: Secondary | ICD-10-CM

## 2018-04-04 DIAGNOSIS — T7800XD Anaphylactic reaction due to unspecified food, subsequent encounter: Secondary | ICD-10-CM

## 2018-04-04 DIAGNOSIS — T7800XA Anaphylactic reaction due to unspecified food, initial encounter: Secondary | ICD-10-CM | POA: Insufficient documentation

## 2018-04-04 DIAGNOSIS — T63481A Toxic effect of venom of other arthropod, accidental (unintentional), initial encounter: Secondary | ICD-10-CM | POA: Insufficient documentation

## 2018-04-04 NOTE — Patient Instructions (Addendum)
Avoid peanuts, tree nuts,edamame and large amounts of soy.  If he has an allergic reaction take Benadryl 50 mg every 4 hours and if he has life-threatening symptoms inject with EpiPen 0.3 mg Then write what he had to eat or drink in the previous 4 hours You had mild reactivity to hops and peas and cabbage but I am not very worried about these Call us if you have another allergic reaction to review what you had eaten before the reaction Your last 2 reactions were most probably related to the edamame ingestion when your grandchild was there with your We will let you know the results of the lupin  serum IgE which is a legumes, just like peanuts and soy bean

## 2018-04-04 NOTE — Progress Notes (Addendum)
[place] [id number] Dept: [phone]  FOLLOW UP NOTE  Patient ID: ARAVIND CHRISMER, male    DOB: September 29, 1955  Age: 62 y.o. MRN: [id number] Date of Office Visit: 04/04/2018  Assessment  Chief Complaint: Urticaria (3 xs last month) and Facial Swelling  HPI DEVAL MROCZKA is a 62 year old male who presents to the clinic for a follow up visit. Marland Kitchen He has had 5 allergic reactions to foods.  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  He has had 5 anaphylactic episodes.  The last 2 episodes he ate things that he had eaten all the time.  We did a limited amount of skin tests on 02/04/2018 and found him to be allergic to peanut, walnut, hazelnut and pistachio.  Other testing was negative.  We only did 8 food  allergy tests.  They would like to have further food allergy testing since 2 of the allergic reactions did not have a clear-cut reason.  He had a negative alpha gal screen, and a negative tryptase.  His Lupin IgE testing is pending   Drug Allergies:  Allergies  Allergen Reactions  . Peanut Oil Anaphylaxis    Allergic to all nuts All nuts   . Peanut-Containing Drug Products Anaphylaxis  . Bee Venom   . Codeine Swelling, Rash and Hives    Physical Exam: BP (!) 142/94   Pulse 60   Temp 98.6 F (37 C) (Oral)   Resp 16   SpO2 95%    Physical Exam Vitals signs reviewed.  Constitutional:      Appearance: Normal appearance. He is normal weight.  HENT:     Head:     Comments: Eyes normal.  Ears normal.  Nose normal.  Pharynx normal. Neck:     Musculoskeletal: Neck supple.  Cardiovascular:     Comments: S1-S2 normal no murmurs Pulmonary:     Comments: Clear to percussion and auscultation Lymphadenopathy:     Cervical: No cervical adenopathy.  Skin:    Comments: Clear  Neurological:     General: No focal deficit present.     Mental Status: He is alert and oriented to person, place, and time.  Psychiatric:        Mood  and Affect: Mood normal.        Behavior: Behavior normal.        Thought Content: Thought content normal.        Judgment: Judgment normal.     Diagnostics: Allergy skin testing to soy showed a 10 x 10 wheal with 20 x 20 of erythema.  He had very slight reactivity to hops , peas and cabbage Skin testing to grass weed and birch was negative  Assessment and Plan: 1. Anaphylactic shock due to food, subsequent encounter   2. Allergic reaction to insect sting, accidental or unintentional, subsequent encounter   3. Current use of beta blocker        Patient Instructions  Avoid peanuts, tree nuts,edamame and large amounts of soy.  If he has an allergic reaction take Benadryl 50 mg every 4 hours and if he has life-threatening symptoms inject with EpiPen 0.3 mg Then write what he had to eat or drink in the previous 4 hours You had mild reactivity to hops and peas and cabbage but I am not very worried about these Call us if you have another allergic reaction to review what you had eaten before the reaction Your last 2  reactions were most probably related to the edamame ingestion when your grandchild was there with your We will let you know the results of the lupin  serum IgE which is a legumes, just like peanuts and soy bean   Return in about 1 year (around 04/05/2019).    Thank you for the opportunity to care for this patient.  Please do not hesitate to contact me with questions.  Penne Lash, M.D.  [facility] [address] [place], [place] [address] [phone]

## 2018-04-07 ENCOUNTER — Encounter: Payer: Self-pay | Admitting: Allergy and Immunology

## 2019-02-28 ENCOUNTER — Telehealth: Payer: Self-pay | Admitting: Gastroenterology

## 2019-02-28 NOTE — Telephone Encounter (Signed)
As long as he is not having any problems, he can do a nurse visit.

## 2019-02-28 NOTE — Telephone Encounter (Signed)
Antonio Houston, does pt need OV or nurse visit for Flex Sig?

## 2019-02-28 NOTE — Telephone Encounter (Signed)
December RECALL FOR FLEX SIG

## 2019-03-01 ENCOUNTER — Encounter: Payer: Self-pay | Admitting: Internal Medicine

## 2019-03-01 NOTE — Telephone Encounter (Signed)
MAILED PATIENT LETTER TO CALL AND SCHEDULE NURSE VISIT

## 2019-06-18 ENCOUNTER — Emergency Department (HOSPITAL_COMMUNITY)
Admission: EM | Admit: 2019-06-18 | Discharge: 2019-06-18 | Disposition: A | Discharge status: Home / Self Care | Payer: 59 | Attending: Emergency Medicine | Admitting: Emergency Medicine

## 2019-06-18 ENCOUNTER — Other Ambulatory Visit: Payer: Self-pay

## 2019-06-18 DIAGNOSIS — Z79899 Other long term (current) drug therapy: Secondary | ICD-10-CM | POA: Diagnosis not present

## 2019-06-18 DIAGNOSIS — L509 Urticaria, unspecified: Secondary | ICD-10-CM | POA: Diagnosis not present

## 2019-06-18 DIAGNOSIS — I10 Essential (primary) hypertension: Secondary | ICD-10-CM | POA: Diagnosis not present

## 2019-06-18 DIAGNOSIS — T7840XA Allergy, unspecified, initial encounter: Secondary | ICD-10-CM | POA: Diagnosis present

## 2019-06-18 DIAGNOSIS — Z7982 Long term (current) use of aspirin: Secondary | ICD-10-CM | POA: Insufficient documentation

## 2019-06-18 DIAGNOSIS — Z91010 Allergy to peanuts: Secondary | ICD-10-CM | POA: Diagnosis not present

## 2019-06-18 MED ORDER — FAMOTIDINE 20 MG PO TABS
20.0000 mg | ORAL_TABLET | Freq: Once | ORAL | Status: AC
  Administered 2019-06-18: 20 mg via ORAL
  Filled 2019-06-18: qty 1

## 2019-06-18 MED ORDER — FAMOTIDINE 20 MG PO TABS: 20.0000 mg | ORAL_TABLET | Freq: Two times a day (BID) | ORAL | 0 refills | Status: DC

## 2019-06-18 NOTE — Discharge Instructions (Addendum)
Please go to your appointment tomorrow with your primary care provider, as scheduled.  Please continue take Benadryl regularly, as directed.  I have also prescribed you famotidine which I would like you to continue taking.  Return to the ED or seek immediate medical attention should you develop any new or worsening symptoms.

## 2019-06-18 NOTE — ED Provider Notes (Signed)
[facility] EMERGENCY DEPARTMENT Provider Note   CSN: [id number] Arrival date & time: 06/18/19  2132     History Chief Complaint  Patient presents with  . Allergic Reaction    Antonio Houston is a 64 y.o. male who presents to the ED urgently for an allergic reaction.  Patient reports that he has had numerous allergic reactions in the past and was able to recognize the symptoms almost immediately.  He reports that his palms began to itch profusely with associated swelling and discoloration.  He then developed hives in the area of his armpits and groin which is consistent with his prior episodes of allergic reaction.  He also felt as though his throat was closing which prompted him to use his EpiPen and take 50 mg Benadryl by mouth.  On arrival, he feels as though his symptoms have predominantly abated.  He denies any current itching, shortness of breath, tongue swelling, abdominal discomfort, nausea or vomiting, worsening rash, or other symptoms.  He had been evaluated by an allergist and was informed that he has an allergy to a type of legume.  While he has avoided those particular beans, he is unsure if perhaps it was an unlisted ingredients in one of his foods.   HPI     Past Medical History:  Diagnosis Date  . 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 Hypertension   . Sleep apnea   . Urticaria     Patient Active Problem List   Diagnosis Date Noted  . Anaphylactic shock due to adverse food reaction 04/04/2018  . Allergic reaction to insect sting 04/04/2018  . Current use of beta blocker 04/04/2018  . Personal history of colonic polyps     Past Surgical History:  Procedure Laterality Date  . COLONOSCOPY N/A 03/28/2018   Procedure: COLONOSCOPY;  Surgeon: Danie Binder, MD;  Location: [facility];  Service: Endoscopy;  Laterality: N/A;  10:30  . POLYPECTOMY  03/28/2018   Procedure: POLYPECTOMY;  Surgeon: Danie Binder, MD;  Location: [facility];  Service: Endoscopy;;  ascending  colon polyp, transverse polyp, rectal polyps x2  . TONSILLECTOMY         Family History  Problem Relation Age of Onset  . Diabetes Mother   . Diabetes Father   . Allergic rhinitis Neg Hx   . Asthma Neg Hx   . Eczema Neg Hx   . Urticaria Neg Hx   . Colon cancer Neg Hx   . Colon polyps Neg Hx     Social History   Tobacco Use  . Smoking status: Never Smoker  . Smokeless tobacco: Never Used  Substance Use Topics  . Alcohol use: Yes    Comment: Occasional  . Drug use: No    Home Medications Prior to Admission medications   Medication Sig Start Date End Date Taking? Authorizing Provider  aspirin EC 81 MG tablet Take 81 mg by mouth every morning.    [provider]  EPINEPHrine (EPIPEN 2-PAK) 0.3 mg/0.3 mL IJ SOAJ injection Inject 0.3 mLs (0.3 mg total) into the muscle once as needed (for severe allergic reaction). CAll 911 immediately if you have to use this medicine 03/08/18   Noemi Chapel, MD  famotidine (PEPCID) 20 MG tablet Take 1 tablet (20 mg total) by mouth 2 (two) times daily. 06/18/19   Corena Herter, PA-C  Melatonin 10 MG TABS Take 10 mg by mouth at bedtime.    [provider]  nebivolol (BYSTOLIC) 10  MG tablet Take 5 mg by mouth daily.    [provider]  Omega-3 Fatty Acids (OMEGA-3 EPA FISH OIL PO) Take 1 capsule by mouth daily.     [provider]  simvastatin (ZOCOR) 80 MG tablet Take 80 mg by mouth at bedtime.    [provider]  telmisartan (MICARDIS) 80 MG tablet Take 40 mg by mouth every morning.     [provider]  Vitamin D, Cholecalciferol, 25 MCG (1000 UT) CAPS Take 1 capsule by mouth daily.     [provider]    Allergies    Peanut oil, Peanut-containing drug products, Bee venom, and Codeine  Review of Systems   Review of Systems  Constitutional: Negative for chills and fever.  Respiratory: Negative for shortness of breath and wheezing.   Musculoskeletal: Negative for gait problem  and joint swelling.  Skin: Positive for color change and rash.  Neurological: Negative for weakness and numbness.    Physical Exam Updated Vital Signs BP 131/73   Pulse 78   Temp 98 F (36.7 C) (Oral)   Resp 16   Ht 5\' 11"  (1.803 m)   Wt 96.2 kg   SpO2 93%   BMI 29.57 kg/m   Physical Exam Vitals and nursing note reviewed. Exam conducted with a chaperone present.  Constitutional:      Appearance: Normal appearance.  HENT:     Head: Normocephalic and atraumatic.     Mouth/Throat:     Comments: Oropharynx is patent, nonerythematous.  No uvular deviation.  No mass appreciated.  No tonsillar hypertrophy.  No tongue swelling.  No induration of floor of mouth.  Tolerating secretions well. Eyes:     General: No scleral icterus.    Conjunctiva/sclera: Conjunctivae normal.  Cardiovascular:     Rate and Rhythm: Normal rate and regular rhythm.     Pulses: Normal pulses.     Heart sounds: Normal heart sounds.  Pulmonary:     Effort: Pulmonary effort is normal. No respiratory distress.     Breath sounds: Normal breath sounds. No wheezing.  Abdominal:     General: Abdomen is flat. There is no distension.     Palpations: Abdomen is soft.     Tenderness: There is no abdominal tenderness. There is no guarding.  Musculoskeletal:     Cervical back: Normal range of motion and neck supple.     Right lower leg: No edema.     Left lower leg: No edema.  Skin:    General: Skin is dry.     Capillary Refill: Capillary refill takes less than 2 seconds.     Comments: Hives noted in the axillary regions bilaterally, most notably on left side.  Erythematous, mildly swollen right hand.  Neurological:     Mental Status: He is alert and oriented to person, place, and time.     GCS: GCS eye subscore is 4. GCS verbal subscore is 5. GCS motor subscore is 6.  Psychiatric:        Mood and Affect: Mood normal.        Behavior: Behavior normal.        Thought Content: Thought content normal.     ED  Results / Procedures / Treatments   Labs (all labs ordered are listed, but only abnormal results are displayed) Labs Reviewed - No data to display  EKG None  Radiology No results found.  Procedures Procedures (including critical care time)  Medications Ordered in ED Medications  famotidine (  PEPCID) tablet 20 mg (20 mg Oral Given 06/18/19 2255)    ED Course  I have reviewed the triage vital signs and the nursing notes.  Pertinent labs & imaging results that were available during my care of the patient were reviewed by me and considered in my medical decision making (see chart for details).    MDM Rules/Calculators/A&P                      While patient was endorsing significant itching, rash, and symptoms of "throat closing", his symptoms had abated by the time he arrived to the ED after his administration of epinephrine and 50 mg of Benadryl.  He adamantly refused steroids here in the ED, but agreed to famotidine and observation for 2 hours given his epinephrine administration.  Initial examination revealed evidence of hives in the area of left axillary region and an erythematous right palm, however his rash had entirely resolved upon subsequent examination.  He is feeling entirely improved and wants to go home.  He again is refusing prednisone burst, however will take the famotidine as prescribed and continue with his Benadryl.  He will be evaluated by his primary care provider tomorrow for his scheduled appointment.  Given duration of time being observed on cardiac monitor after epinephrine administration, feel as though he is now safe for discharge.     Final Clinical Impression(s) / ED Diagnoses Final diagnoses:  Allergic reaction, initial encounter    Rx / DC Orders ED Discharge Orders         Ordered    famotidine (PEPCID) 20 MG tablet  2 times daily     06/18/19 2341           Reita Chard 06/18/19 2341    Noemi Chapel, MD 06/19/19 [phone]

## 2019-06-18 NOTE — ED Triage Notes (Signed)
Pt C/O hives, itching, and swelling in his palms. Pt used epi pen and 50mg  benadryl prior to arrival.

## 2019-10-03 ENCOUNTER — Encounter (HOSPITAL_COMMUNITY): Payer: Self-pay | Admitting: Emergency Medicine

## 2019-10-03 ENCOUNTER — Emergency Department (HOSPITAL_COMMUNITY)
Admission: EM | Admit: 2019-10-03 | Discharge: 2019-10-03 | Disposition: A | Discharge status: Home / Self Care | Payer: Medicare Other | Attending: Emergency Medicine | Admitting: Emergency Medicine

## 2019-10-03 ENCOUNTER — Other Ambulatory Visit: Payer: Self-pay

## 2019-10-03 DIAGNOSIS — T7840XA Allergy, unspecified, initial encounter: Secondary | ICD-10-CM | POA: Insufficient documentation

## 2019-10-03 DIAGNOSIS — I10 Essential (primary) hypertension: Secondary | ICD-10-CM | POA: Insufficient documentation

## 2019-10-03 DIAGNOSIS — Z7982 Long term (current) use of aspirin: Secondary | ICD-10-CM | POA: Insufficient documentation

## 2019-10-03 DIAGNOSIS — Z79899 Other long term (current) drug therapy: Secondary | ICD-10-CM | POA: Insufficient documentation

## 2019-10-03 DIAGNOSIS — Z91010 Allergy to peanuts: Secondary | ICD-10-CM | POA: Insufficient documentation

## 2019-10-03 MED ORDER — EPINEPHRINE 0.3 MG/0.3ML IJ SOAJ: 0.3000 mg | INTRAMUSCULAR | 0 refills | Status: DC | PRN

## 2019-10-03 MED ORDER — FAMOTIDINE 20 MG PO TABS
20.0000 mg | ORAL_TABLET | Freq: Once | ORAL | Status: AC
  Administered 2019-10-03: 20 mg via ORAL
  Filled 2019-10-03: qty 1

## 2019-10-03 MED ORDER — DEXAMETHASONE SODIUM PHOSPHATE 10 MG/ML IJ SOLN
10.0000 mg | Freq: Once | INTRAMUSCULAR | Status: AC
  Administered 2019-10-03: 10 mg via INTRAVENOUS
  Filled 2019-10-03: qty 1

## 2019-10-03 NOTE — ED Triage Notes (Signed)
Pt c/o hand itching and swelling, ear ringing, rash, and feeling like he had to clear his throat at 2130. Pt states he took two benadryl's and an epipen about 1145.

## 2019-10-03 NOTE — ED Provider Notes (Signed)
[facility] EMERGENCY DEPARTMENT Provider Note   CSN: [id number] Arrival date & time: 10/03/19  0008   Time seen 1:00 AM  History Chief Complaint  Patient presents with  . Allergic Reaction    Antonio Houston is a 64 y.o. male.  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  He states this evening they were having dinner and his son-in-law cut a cake that had pecans on it and heat later on use that knife.  About 9:30 PM he started having swelling and itching of his hands and developed hives under his axilla and in his back.  He states his ears were ringing.  His wife states his face was very red he was not pale.  He denies feeling like he was going to pass out.  He states he had to clear his throat but he denies feeling short of breath.  He states he had mild trouble swallowing.  He took Benadryl and used his EpiPen.  He states he was still having symptoms when he arrived to the ED however when I saw him he states his symptoms have resolved.  He has had 6 prior ED visits in our system for allergic reactions.  PCP Franne Forts, MD (Inactive)    Past Medical History:  Diagnosis Date  . 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 Hypertension   . Sleep apnea   . Urticaria     Patient Active Problem List   Diagnosis Date Noted  . Anaphylactic shock due to adverse food reaction 04/04/2018  . Allergic reaction to insect sting 04/04/2018  . Current use of beta blocker 04/04/2018  . Personal history of colonic polyps     Past Surgical History:  Procedure Laterality Date  . COLONOSCOPY N/A 03/28/2018   Procedure: COLONOSCOPY;  Surgeon: Danie Binder, MD;  [facility];  Service: Endoscopy;  Laterality: N/A;  10:30  . ESOPHAGOGASTRODUODENOSCOPY ENDOSCOPY    . POLYPECTOMY  03/28/2018   Procedure: POLYPECTOMY;  Surgeon: Danie Binder, MD;  Location: [facility];  Service: Endoscopy;;  ascending colon polyp, transverse polyp, rectal polyps x2  . TONSILLECTOMY         Family  History  Problem Relation Age of Onset  . Diabetes Mother   . Diabetes Father   . Allergic rhinitis Neg Hx   . Asthma Neg Hx   . Eczema Neg Hx   . Urticaria Neg Hx   . Colon cancer Neg Hx   . Colon polyps Neg Hx     Social History   Tobacco Use  . Smoking status: Never Smoker  . Smokeless tobacco: Never Used  Vaping Use  . Vaping Use: Never used  Substance Use Topics  . Alcohol use: Yes    Comment: Occasional  . Drug use: No    Home Medications Prior to Admission medications   Medication Sig Start Date End Date Taking? Authorizing Provider  aspirin EC 81 MG tablet Take 81 mg by mouth every morning.    [provider]  EPINEPHrine (EPIPEN 2-PAK) 0.3 mg/0.3 mL IJ SOAJ injection Inject 0.3 mLs (0.3 mg total) into the muscle as needed for anaphylaxis. 10/03/19   Rolland Porter, MD  famotidine (PEPCID) 20 MG tablet Take 1 tablet (20 mg total) by mouth 2 (two) times daily. 06/18/19   Corena Herter, PA-C  Melatonin 10 MG TABS Take 10 mg by mouth at bedtime.    [provider]  nebivolol (BYSTOLIC) 10 MG  tablet Take 5 mg by mouth daily.    [provider]  Omega-3 Fatty Acids (OMEGA-3 EPA FISH OIL PO) Take 1 capsule by mouth daily.     [provider]  simvastatin (ZOCOR) 80 MG tablet Take 80 mg by mouth at bedtime.    [provider]  telmisartan (MICARDIS) 80 MG tablet Take 40 mg by mouth every morning.     [provider]  Vitamin D, Cholecalciferol, 25 MCG (1000 UT) CAPS Take 1 capsule by mouth daily.     [provider]    Allergies    Peanut oil, Peanut-containing drug products, Bee venom, Other, and Codeine  Review of Systems   Review of Systems  All other systems reviewed and are negative.   Physical Exam Updated Vital Signs BP 117/62   Pulse 61   Temp 98 F (36.7 C) (Oral)   Resp 18   Ht 5\' 11"  (1.803 m)   Wt 97.5 kg   SpO2 92%   BMI 29.99 kg/m   Physical Exam Vitals and nursing note reviewed.    Constitutional:      Appearance: Normal appearance. He is normal weight.  HENT:     Head: Normocephalic and atraumatic.     Right Ear: External ear normal.     Left Ear: External ear normal.     Nose: Nose normal.     Mouth/Throat:     Mouth: Mucous membranes are moist.     Pharynx: No oropharyngeal exudate or posterior oropharyngeal erythema.     Comments: No swelling of tongue or soft palate seen.  Uvula is midline and normal.  Voice is normal. Eyes:     Extraocular Movements: Extraocular movements intact.     Conjunctiva/sclera: Conjunctivae normal.     Pupils: Pupils are equal, round, and reactive to light.  Cardiovascular:     Rate and Rhythm: Normal rate.     Pulses: Normal pulses.  Pulmonary:     Effort: Pulmonary effort is normal. No respiratory distress.     Breath sounds: Normal breath sounds.  Musculoskeletal:        General: Normal range of motion.     Cervical back: Normal range of motion.  Skin:    General: Skin is warm and dry.     Comments: Patient has a few small scattered urticaria on his back.  His axilla were examined and are without rash.  His chest and abdomen are without rash.  There is no rash on his arms or his legs.  He does appear to have some mild swelling of the palms of his hands.  Neurological:     General: No focal deficit present.     Mental Status: He is alert and oriented to person, place, and time.     Cranial Nerves: No cranial nerve deficit.  Psychiatric:        Mood and Affect: Mood normal.        Behavior: Behavior normal.        Thought Content: Thought content normal.     ED Results / Procedures / Treatments   Labs (all labs ordered are listed, but only abnormal results are displayed) Labs Reviewed - No data to display  EKG None  Radiology No results found.  Procedures Procedures (including critical care time)  Medications Ordered in ED Medications  dexamethasone (DECADRON) injection 10 mg (10 mg Intravenous Given  10/03/19 0116)  famotidine (PEPCID) tablet 20 mg (20 mg Oral Given 10/03/19 0116)  ED Course  I have reviewed the triage vital signs and the nursing notes.  Pertinent labs & imaging results that were available during my care of the patient were reviewed by me and considered in my medical decision making (see chart for details).    MDM Rules/Calculators/A&P                          Patient was given Decadron IV and Pepcid orally.  Patient states that if I give him a prescription for prednisone he will not get it filled.  He states generally he has the big acute reaction and then he does not have any lingering rash afterwards.  He states actually he feels fine now except for some mild swelling of his hands.  He was given a another prescription for EpiPen.    Final Clinical Impression(s) / ED Diagnoses Final diagnoses:  Allergic reaction, initial encounter    Rx / DC Orders ED Discharge Orders         Ordered    EPINEPHrine (EPIPEN 2-PAK) 0.3 mg/0.3 mL IJ SOAJ injection  As needed     Discontinue  Reprint     10/03/19 0122        OTC pepcid and benadryl  Plan discharge  Rolland Porter, MD, Barbette Or, MD 10/03/19 [phone]

## 2019-10-03 NOTE — Discharge Instructions (Addendum)
Take the Pepcid over-the-counter twice a day for the next several days.  Use Benadryl 50 mg every 6 hours if needed for swelling, itching, or hives.  I ordered you another EpiPen if needed again in the future.  Return to the emergency department if you get worse.

## 2021-03-18 ENCOUNTER — Encounter: Payer: Self-pay | Admitting: *Deleted

## 2022-01-01 ENCOUNTER — Encounter (HOSPITAL_COMMUNITY): Payer: Self-pay | Admitting: Emergency Medicine

## 2022-01-01 ENCOUNTER — Emergency Department (HOSPITAL_COMMUNITY)
Admission: EM | Admit: 2022-01-01 | Discharge: 2022-01-01 | Disposition: A | Discharge status: Home / Self Care | Payer: Medicare Other | Attending: Emergency Medicine | Admitting: Emergency Medicine

## 2022-01-01 DIAGNOSIS — Z91010 Allergy to peanuts: Secondary | ICD-10-CM | POA: Insufficient documentation

## 2022-01-01 DIAGNOSIS — Z7982 Long term (current) use of aspirin: Secondary | ICD-10-CM | POA: Diagnosis not present

## 2022-01-01 DIAGNOSIS — T7840XA Allergy, unspecified, initial encounter: Secondary | ICD-10-CM | POA: Diagnosis present

## 2022-01-01 LAB — CBC
HCT: 45.5 % (ref 39.0–52.0)
Hemoglobin: 16.3 g/dL (ref 13.0–17.0)
MCH: 32.8 pg (ref 26.0–34.0)
MCHC: 35.8 g/dL (ref 30.0–36.0)
MCV: 91.5 fL (ref 80.0–100.0)
Platelets: 244 10*3/uL (ref 150–400)
RBC: 4.97 MIL/uL (ref 4.22–5.81)
RDW: 12.3 % (ref 11.5–15.5)
WBC: 10.1 10*3/uL (ref 4.0–10.5)
nRBC: 0 % (ref 0.0–0.2)

## 2022-01-01 LAB — BASIC METABOLIC PANEL
Anion gap: 11 (ref 5–15)
BUN: 16 mg/dL (ref 8–23)
CO2: 26 mmol/L (ref 22–32)
Calcium: 10 mg/dL (ref 8.9–10.3)
Chloride: 100 mmol/L (ref 98–111)
Creatinine, Ser: 0.92 mg/dL (ref 0.61–1.24)
GFR, Estimated: >60 mL/min (ref >60)
Glucose, Bld: 163 mg/dL — ABNORMAL HIGH (ref 70–99)
Potassium: 3 mmol/L — ABNORMAL LOW (ref 3.5–5.1)
Sodium: 137 mmol/L (ref 135–145)

## 2022-01-01 MED ORDER — METHYLPREDNISOLONE SODIUM SUCC 125 MG IJ SOLR
125.0000 mg | Freq: Once | INTRAMUSCULAR | Status: AC
  Administered 2022-01-01: 125 mg via INTRAVENOUS
  Filled 2022-01-01: qty 2

## 2022-01-01 MED ORDER — FAMOTIDINE IN NACL 20-0.9 MG/50ML-% IV SOLN
20.0000 mg | Freq: Once | INTRAVENOUS | Status: AC
  Administered 2022-01-01: 20 mg via INTRAVENOUS
  Filled 2022-01-01: qty 50

## 2022-01-01 MED ORDER — EPINEPHRINE 0.3 MG/0.3ML IJ SOAJ: 0.3000 mg | INTRAMUSCULAR | 2 refills | Status: AC | PRN

## 2022-01-01 MED ORDER — DIPHENHYDRAMINE HCL 50 MG/ML IJ SOLN
50.0000 mg | Freq: Once | INTRAMUSCULAR | Status: AC
  Administered 2022-01-01: 50 mg via INTRAVENOUS
  Filled 2022-01-01: qty 1

## 2022-01-01 NOTE — ED Triage Notes (Signed)
Pt arrives POV c/o sudden allergic reaction. Pt states he went to bed fine and woke up with itching and hives. Pt c/o of some mouth swelling. Pt took epi pen and 2 benadryl PTA.

## 2022-01-01 NOTE — ED Provider Notes (Signed)
[facility] EMERGENCY DEPARTMENT Provider Note   CSN: [id number] Arrival date & time: 01/01/22  0100     History  Chief Complaint  Patient presents with   Allergic Reaction    Antonio Houston is a 66 y.o. male.  Patient presents to the emergency department for evaluation of allergic reaction.  Patient was awakened from sleep with itching, mouth swelling.  He reports a history of allergy to soy and tree nuts.  He does not think he came into contact with any of these.  Patient reports that his reactions can be quite severe.  He uses EpiPen at home and came to the ED.  He also took 2 Benadryl.  Mild swelling improved but he is developing a rash now.       Home Medications Prior to Admission medications   Medication Sig Start Date End Date Taking? Authorizing Provider  aspirin EC 81 MG tablet Take 81 mg by mouth every morning.    [provider]  EPINEPHrine (EPIPEN 2-PAK) 0.3 mg/0.3 mL IJ SOAJ injection Inject 0.3 mg into the muscle as needed for anaphylaxis. 01/01/22   Orpah Greek, MD  famotidine (PEPCID) 20 MG tablet Take 1 tablet (20 mg total) by mouth 2 (two) times daily. 06/18/19   Corena Herter, PA-C  Melatonin 10 MG TABS Take 10 mg by mouth at bedtime.    [provider]  nebivolol (BYSTOLIC) 10 MG tablet Take 5 mg by mouth daily.    [provider]  Omega-3 Fatty Acids (OMEGA-3 EPA FISH OIL PO) Take 1 capsule by mouth daily.     [provider]  simvastatin (ZOCOR) 80 MG tablet Take 80 mg by mouth at bedtime.    [provider]  telmisartan (MICARDIS) 80 MG tablet Take 40 mg by mouth every morning.     [provider]  Vitamin D, Cholecalciferol, 25 MCG (1000 UT) CAPS Take 1 capsule by mouth daily.     [provider]      Allergies    Peanut oil, Peanut-containing drug products, Bee venom, Other, and Codeine    Review of Systems   Review of Systems  Physical Exam Updated Vital Signs BP 139/83    Pulse 64   Resp 18   Ht '5\' 11"'$  (1.803 m)   Wt 97.5 kg   SpO2 95%   BMI 29.98 kg/m  Physical Exam Vitals and nursing note reviewed.  Constitutional:      General: He is not in acute distress.    Appearance: He is well-developed.  HENT:     Head: Normocephalic and atraumatic.     Mouth/Throat:     Mouth: Mucous membranes are moist.  Eyes:     General: Vision grossly intact. Gaze aligned appropriately.     Extraocular Movements: Extraocular movements intact.     Conjunctiva/sclera: Conjunctivae normal.  Cardiovascular:     Rate and Rhythm: Normal rate and regular rhythm.     Pulses: Normal pulses.     Heart sounds: Normal heart sounds, S1 normal and S2 normal. No murmur heard.    No friction rub. No gallop.  Pulmonary:     Effort: Pulmonary effort is normal. No respiratory distress.     Breath sounds: Normal breath sounds.  Abdominal:     Palpations: Abdomen is soft.     Tenderness: There is no abdominal tenderness. There is no guarding or rebound.     Hernia: No hernia is present.  Musculoskeletal:  General: No swelling.     Cervical back: Full passive range of motion without pain, normal range of motion and neck supple. No pain with movement, spinous process tenderness or muscular tenderness. Normal range of motion.     Right lower leg: No edema.     Left lower leg: No edema.  Skin:    General: Skin is warm and dry.     Capillary Refill: Capillary refill takes less than 2 seconds.     Findings: Rash (Diffuse urticaria) present. No ecchymosis, erythema, lesion or wound.  Neurological:     Mental Status: He is alert and oriented to person, place, and time.     GCS: GCS eye subscore is 4. GCS verbal subscore is 5. GCS motor subscore is 6.     Cranial Nerves: Cranial nerves 2-12 are intact.     Sensory: Sensation is intact.     Motor: Motor function is intact. No weakness or abnormal muscle tone.     Coordination: Coordination is intact.  Psychiatric:        Mood  and Affect: Mood normal.        Speech: Speech normal.        Behavior: Behavior normal.     ED Results / Procedures / Treatments   Labs (all labs ordered are listed, but only abnormal results are displayed) Labs Reviewed  BASIC METABOLIC PANEL - Abnormal; Notable for the following components:      Result Value   Potassium 3.0 (*)    Glucose, Bld 163 (*)    All other components within normal limits  CBC  URINALYSIS, ROUTINE W REFLEX MICROSCOPIC    EKG None  Radiology No results found.  Procedures Procedures    Medications Ordered in ED Medications  diphenhydrAMINE (BENADRYL) injection 50 mg (50 mg Intravenous Given 01/01/22 0016)  famotidine (PEPCID) IVPB 20 mg premix (0 mg Intravenous Stopped 01/01/22 0205)  methylPREDNISolone sodium succinate (SOLU-MEDROL) 125 mg/2 mL injection 125 mg (125 mg Intravenous Given 01/01/22 0118)    ED Course/ Medical Decision Making/ A&P                           Medical Decision Making Amount and/or Complexity of Data Reviewed Labs: ordered.  Risk Prescription drug management.   Presents to the emergency department for evaluation of allergic reaction.  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  He is unaware of any contact with these products.  He does, however, think the last couple of times he had a reaction he had taken diclofenac and he did take ibuprofen tonight because he is being worked up for kidney stones.  Patient administered his EpiPen at home.  He did have diffuse urticaria at arrival but this has resolved with additional Benadryl, Pepcid, Solu-Medrol.  He has been monitored and has done well.  Vital signs normal.  I recommended continued prednisone but he declines because it causes severe agitation.  I have therefore recommended that he stay on a scheduled dose of Benadryl for at least 2 days and given return instructions.        Final Clinical Impression(s) / ED Diagnoses Final diagnoses:   Allergic reaction, initial encounter    Rx / DC Orders ED Discharge Orders          Ordered    EPINEPHrine (EPIPEN 2-PAK) 0.3 mg/0.3 mL IJ SOAJ injection  As needed  01/01/22 Trent Woods, Ceylon Arenson J, MD 01/01/22 [phone]

## 2022-01-01 NOTE — Discharge Instructions (Addendum)
Take Benadryl every 6 hours for the next 2 or 3 days.  If your symptoms significantly worsen, administer your EpiPen and call 911.

## 2022-02-09 HISTORY — PX: OTHER SURGICAL HISTORY: SHX169

## 2022-02-11 ENCOUNTER — Encounter (HOSPITAL_BASED_OUTPATIENT_CLINIC_OR_DEPARTMENT_OTHER): Payer: Self-pay | Admitting: Emergency Medicine

## 2022-02-11 ENCOUNTER — Emergency Department (HOSPITAL_BASED_OUTPATIENT_CLINIC_OR_DEPARTMENT_OTHER): Payer: Medicare Other

## 2022-02-11 ENCOUNTER — Emergency Department (HOSPITAL_BASED_OUTPATIENT_CLINIC_OR_DEPARTMENT_OTHER)
Admission: EM | Admit: 2022-02-11 | Discharge: 2022-02-11 | Disposition: A | Discharge status: Home / Self Care | Payer: Medicare Other | Attending: Emergency Medicine | Admitting: Emergency Medicine

## 2022-02-11 ENCOUNTER — Other Ambulatory Visit: Payer: Self-pay

## 2022-02-11 DIAGNOSIS — N179 Acute kidney failure, unspecified: Secondary | ICD-10-CM | POA: Diagnosis not present

## 2022-02-11 DIAGNOSIS — N200 Calculus of kidney: Secondary | ICD-10-CM

## 2022-02-11 DIAGNOSIS — N202 Calculus of kidney with calculus of ureter: Secondary | ICD-10-CM | POA: Diagnosis not present

## 2022-02-11 DIAGNOSIS — R109 Unspecified abdominal pain: Secondary | ICD-10-CM | POA: Diagnosis present

## 2022-02-11 DIAGNOSIS — I10 Essential (primary) hypertension: Secondary | ICD-10-CM | POA: Diagnosis not present

## 2022-02-11 DIAGNOSIS — R310 Gross hematuria: Secondary | ICD-10-CM

## 2022-02-11 LAB — CBC
HCT: 45.8 % (ref 39.0–52.0)
Hemoglobin: 16 g/dL (ref 13.0–17.0)
MCH: 32.9 pg (ref 26.0–34.0)
MCHC: 34.9 g/dL (ref 30.0–36.0)
MCV: 94.2 fL (ref 80.0–100.0)
Platelets: 180 10*3/uL (ref 150–400)
RBC: 4.86 MIL/uL (ref 4.22–5.81)
RDW: 12.6 % (ref 11.5–15.5)
WBC: 10.7 10*3/uL — ABNORMAL HIGH (ref 4.0–10.5)
nRBC: 0 % (ref 0.0–0.2)

## 2022-02-11 LAB — URINALYSIS, ROUTINE W REFLEX MICROSCOPIC
Bilirubin Urine: NEGATIVE
Glucose, UA: >1000 mg/dL — AB
Ketones, ur: NEGATIVE mg/dL
Nitrite: POSITIVE — AB
Protein, ur: 100 mg/dL — AB
RBC / HPF: >50 RBC/hpf — ABNORMAL HIGH (ref 0–5)
Specific Gravity, Urine: 1.022 (ref 1.005–1.030)
WBC, UA: >50 WBC/hpf — ABNORMAL HIGH (ref 0–5)
pH: 6 (ref 5.0–8.0)

## 2022-02-11 LAB — BASIC METABOLIC PANEL
Anion gap: 11 (ref 5–15)
BUN: 24 mg/dL — ABNORMAL HIGH (ref 8–23)
CO2: 25 mmol/L (ref 22–32)
Calcium: 10.1 mg/dL (ref 8.9–10.3)
Chloride: 102 mmol/L (ref 98–111)
Creatinine, Ser: 1.65 mg/dL — ABNORMAL HIGH (ref 0.61–1.24)
GFR, Estimated: 45 mL/min — ABNORMAL LOW (ref >60)
Glucose, Bld: 136 mg/dL — ABNORMAL HIGH (ref 70–99)
Potassium: 3.8 mmol/L (ref 3.5–5.1)
Sodium: 138 mmol/L (ref 135–145)

## 2022-02-11 MED ORDER — CEPHALEXIN 500 MG PO CAPS: 500.0000 mg | ORAL_CAPSULE | Freq: Four times a day (QID) | ORAL | 0 refills | Status: AC

## 2022-02-11 MED ORDER — SODIUM CHLORIDE 0.9 % IV BOLUS
1000.0000 mL | Freq: Once | INTRAVENOUS | Status: AC
  Administered 2022-02-11: 1000 mL via INTRAVENOUS

## 2022-02-11 MED ORDER — HYDROMORPHONE HCL 1 MG/ML IJ SOLN
1.0000 mg | Freq: Once | INTRAMUSCULAR | Status: AC
  Administered 2022-02-11: 1 mg via INTRAVENOUS
  Filled 2022-02-11: qty 1

## 2022-02-11 MED ORDER — OXYCODONE HCL 5 MG PO TABS: 5.0000 mg | ORAL_TABLET | ORAL | 0 refills | Status: AC | PRN

## 2022-02-11 MED ORDER — SODIUM CHLORIDE 0.9 % IV SOLN
2.0000 g | Freq: Once | INTRAVENOUS | Status: AC
  Administered 2022-02-11: 2 g via INTRAVENOUS
  Filled 2022-02-11: qty 20

## 2022-02-11 NOTE — ED Triage Notes (Signed)
Renal stone procedure on Monday at 3pm. Some pain after but not starting with intense pain on left side and burning pain in penis. "I dont see the string I hope the stent didn't move"

## 2022-02-11 NOTE — Discharge Instructions (Addendum)
You were evaluated in the Emergency Department and after careful evaluation, we did not find any emergent condition requiring admission or further testing in the hospital.  CT scan showing a residual kidney stone on the right.  Urine sample also showing some evidence to suggest infection.  Continue the Flomax daily.  Use Tylenol every 4-6 hours at home for pain.  Use the oxycodone for more significant pain.  Take the Keflex antibiotic as directed.  Call the [facility] office later today to schedule follow-up.  Dr. Link Snuffer was consulted here in the emergency department and should be aware of your case.  Please return to the Emergency Department if you experience any worsening of your condition such as increased pain or fever or chills.  Thank you for allowing [place] to be a part of your care.

## 2022-02-11 NOTE — ED Provider Notes (Signed)
[facility] Emergency Department Provider Note MRN:  [id number]  Arrival date & time: 02/11/22     Chief Complaint   Post-op Problem   History of Present Illness   Antonio Houston is a 66 y.o. year-old male with a history of kidney stones presenting to the ED with chief complaint of postop problem.  Patient had lithotripsy and stent placement for kidney stones recently.  He had a lot of pain last night post procedure, felt better during the day, but this evening having return of severe pain.  Located in the right flank.  Occasionally has a sensation of sharp glass in the penis, also noting some hematuria.  Feels like the stent string is missing, unsure if the stent removed upward or fell out.  Review of Systems  A thorough review of systems was obtained and all systems are negative except as noted in the HPI and PMH.   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    Hypertension    Sleep apnea    Urticaria     Past Surgical History:  Procedure Laterality Date   COLONOSCOPY N/A 03/28/2018   Procedure: COLONOSCOPY;  Surgeon: Antonio Binder, MD;  [facility];  Service: Endoscopy;  Laterality: N/A;  10:30   ESOPHAGOGASTRODUODENOSCOPY ENDOSCOPY     POLYPECTOMY  03/28/2018   Procedure: POLYPECTOMY;  Surgeon: Antonio Binder, MD;  [facility]: [facility];  Service: Endoscopy;;  ascending colon polyp, transverse polyp, rectal polyps x2   renal stone removal     TONSILLECTOMY      Family History  Problem Relation Age of Onset   Diabetes Mother    Diabetes Father    Allergic rhinitis Neg Hx    Asthma Neg Hx    Eczema Neg Hx    Urticaria Neg Hx    Colon cancer Neg Hx    Colon polyps Neg Hx     Social History   Socioeconomic History   Marital status: Married    Spouse name: Not on file   Number of children: Not on file   Years of education: Not on file   Highest education level: Not on  file  Occupational History   Not on file  Tobacco Use   Smoking status: Never   Smokeless tobacco: Never  Vaping Use   Vaping Use: Never used  Substance and Sexual Activity   Alcohol use: Yes    Comment: Occasional   Drug use: No   Sexual activity: Not on file  Other Topics Concern   Not on file  Social History Narrative   Not on file   Social Determinants of Health   Financial Resource Strain: Not on file  Food Insecurity: Not on file  Transportation Needs: Not on file  Physical Activity: Not on file  Stress: Not on file  Social Connections: Not on file  Intimate Partner Violence: Not on file     Physical Exam   Vitals:   02/11/22 0330 02/11/22 0438  BP: 123/75 120/64  Pulse: (!) 53 (!) 55  Resp: (!) 22 18  Temp:    SpO2: 94% 94%    CONSTITUTIONAL: Well-appearing, in mild distress due to pain NEURO/PSYCH:  Alert and oriented x 3, no focal deficits EYES:  eyes equal and reactive ENT/NECK:  no LAD, no JVD CARDIO: Regular rate, well-perfused, normal S1 and S2 PULM:  CTAB no wheezing or rhonchi GI/GU:  non-distended, non-tender MSK/SPINE:  No gross deformities, no edema SKIN:  no rash, atraumatic   *Additional and/or pertinent findings included in MDM below  Diagnostic and Interventional Summary    EKG Interpretation  Date/Time:    Ventricular Rate:    PR Interval:    QRS Duration:   QT Interval:    QTC Calculation:   R Axis:     Text Interpretation:         Labs Reviewed  URINALYSIS, ROUTINE W REFLEX MICROSCOPIC - Abnormal; Notable for the following components:      Result Value   APPearance CLOUDY (*)    Glucose, UA >1,000 (*)    Hgb urine dipstick LARGE (*)    Protein, ur 100 (*)    Nitrite POSITIVE (*)    Leukocytes,Ua Houston (*)    RBC / HPF >50 (*)    WBC, UA >50 (*)    Bacteria, UA FEW (*)    All other components within normal limits  CBC - Abnormal; Notable for the following components:   WBC 10.7 (*)    All other components  within normal limits  BASIC METABOLIC PANEL - Abnormal; Notable for the following components:   Glucose, Bld 136 (*)    BUN 24 (*)    Creatinine, Ser 1.65 (*)    GFR, Estimated 45 (*)    All other components within normal limits  URINE CULTURE    CT RENAL STONE STUDY  Final Result      Medications  sodium chloride 0.9 % bolus 1,000 mL (0 mLs Intravenous Stopped 02/11/22 0351)  HYDROmorphone (DILAUDID) injection 1 mg (1 mg Intravenous Given 02/11/22 0235)  sodium chloride 0.9 % bolus 1,000 mL (0 mLs Intravenous Stopped 02/11/22 0500)  cefTRIAXone (ROCEPHIN) 2 g in sodium chloride 0.9 % 100 mL IVPB (0 g Intravenous Stopped 02/11/22 0500)     Procedures  /  Critical Care Procedures  ED Course and Medical Decision Making  Initial Impression and Ddx Suspect pain related to kidney stone migration, stent migration also considered.  Pyelonephritis felt to be less likely, awaiting labs and CT imaging, providing pain control and will reassess.  Past medical/surgical history that increases complexity of ED encounter: Kidney stones  Interpretation of Diagnostics I personally reviewed the laboratory assessment and my interpretation is as follows: AKI noted, otherwise no significant blood count or electrolyte disturbance  Urinalysis is nitrate positive with some bacteria suspicious for infection.  CT imaging revealing a 6 mm stone on the right with migration.  Patient Reassessment and Ultimate Disposition/Management     Case discussed with Antonio Houston of [facility] who will be happy to see patient in follow-up.  Patient is demonstrating no evidence of sepsis and so can be treated with outpatient antibiotics.  Strict return precaution for any worsening pain or fever.  Patient management required discussion with the following services or consulting groups:  Urology  Complexity of Problems Addressed Acute illness or injury that poses threat of life of bodily function  Additional Data  Reviewed and Analyzed Further history obtained from: Further history from spouse/family member  Additional Factors Impacting ED Encounter Risk Use of parenteral controlled substances and Consideration of hospitalization  Antonio Houston. Antonio Houston, [place] [email or web address]  Final Clinical Impressions(s) / ED Diagnoses     ICD-10-CM   1. Flank pain  R10.9     2. Gross hematuria  R31.0     3. Kidney stone  N20.0  ED Discharge Orders          Ordered    oxyCODONE (ROXICODONE) 5 MG immediate release tablet  Every 4 hours PRN        02/11/22 0435    cephALEXin (KEFLEX) 500 MG capsule  4 times daily        02/11/22 0436             Discharge Instructions Discussed with and Provided to Patient:     Discharge Instructions      You were evaluated in the Emergency Department and after careful evaluation, we did not find any emergent condition requiring admission or further testing in the hospital.  CT scan showing a residual kidney stone on the right.  Urine sample also showing some evidence to suggest infection.  Continue the Flomax daily.  Use Tylenol every 4-6 hours at home for pain.  Use the oxycodone for more significant pain.  Take the Keflex antibiotic as directed.  Call the [facility] office later today to schedule follow-up.  Dr. Link Snuffer was consulted here in the emergency department and should be aware of your case.  Please return to the Emergency Department if you experience any worsening of your condition such as increased pain or fever or chills.  Thank you for allowing [place] to be a part of your care.        Maudie Flakes, MD 02/11/22 [phone]

## 2022-02-12 LAB — URINE CULTURE: Culture: NO GROWTH

## 2022-02-13 ENCOUNTER — Other Ambulatory Visit: Payer: Self-pay | Admitting: Urology

## 2022-02-13 ENCOUNTER — Encounter (HOSPITAL_COMMUNITY): Payer: Self-pay

## 2022-02-13 ENCOUNTER — Encounter (HOSPITAL_COMMUNITY): Payer: Self-pay | Admitting: Urology

## 2022-02-13 ENCOUNTER — Other Ambulatory Visit: Payer: Self-pay

## 2022-02-13 NOTE — Progress Notes (Addendum)
For Short Stay: COVID SWAB appointment date: N/A Date of COVID positive in last 90 days: N/A  Bowel Prep reminder: N/A   For Anesthesia: PCP - Franne Forts, MD office visit note 01/22/22 Cardiologist - N/A  Chest x-ray - N/A EKG - N/A Stress Test - greater than 2 year ECHO - greater than 2 year Cardiac Cath - N/A Pacemaker/ICD device last checked: N/A Pacemaker orders received: N/A Device Rep notified: N/A  Spinal Cord Stimulator:N/A  Sleep Study - Yes CPAP - Yes  Fasting Blood Sugar - N/A Checks Blood Sugar __N/A___ times a day Date and result of last Hgb A1c- 01/22/22 6.3  Blood Thinner Instructions:N/A Aspirin Instructions: Last Dose: 02/13/22  Activity level: Able to exercise without chest pain and/or shortness of breath        Anesthesia review: OSA, HTN, DM  Patient denies shortness of breath, fever, cough and chest pain at PAT appointment   Patient verbalized understanding of instructions that were given to them at the PAT appointment. Patient was also instructed that they will need to review over the PAT instructions again at home before surgery.

## 2022-02-13 NOTE — Patient Instructions (Addendum)
SURGICAL WAITING ROOM VISITATION Patients having surgery or a procedure may have no more than 2 support people in the waiting area - these visitors may rotate.   Children under the age of 62 must have an adult with them who is not the patient. If the patient needs to stay at the hospital during part of their recovery, the visitor guidelines for inpatient rooms apply. Pre-op nurse will coordinate an appropriate time for 1 support person to accompany patient in pre-op.  This support person may not rotate.    Please refer to the [facility] website for the visitor guidelines for Inpatients (after your surgery is over and you are in a regular room).       Your procedure is scheduled on: Tuesday, Oct. 31, 2023   Report to [facility] Main Entrance    Report to admitting at 10:45 AM   Call this number if you have problems the morning of surgery [phone]   Do not eat or drink food :After Midnight.  FOLLOW BOWEL PREP AND ANY ADDITIONAL PRE OP INSTRUCTIONS YOU RECEIVED FROM YOUR SURGEON'S OFFICE!!!     Oral Hygiene is also important to reduce your risk of infection.                                    Remember - BRUSH YOUR TEETH THE MORNING OF SURGERY WITH YOUR REGULAR TOOTHPASTE   Do NOT smoke after Midnight   Stop taking Xigduo on Friday, Oct. 27, 2023   Take these medicines the morning of surgery with A SIP OF WATER: Famotidine, Nebivolol, Amlodipine, Rosuvastatin, Tamsulosin, Vibegron  DO NOT TAKE ANY ORAL DIABETIC MEDICATIONS DAY OF YOUR SURGERY  Bring CPAP mask and tubing day of surgery.                              You may not have any metal on your body including jewelry, and body piercing             Do not wear lotions, powders, perfumes/cologne, or deodorant              Men may shave face and neck.   Do not bring valuables to the hospital. [place].   Contacts, dentures or bridgework may not be worn into  surgery.  DO NOT [place]. PHARMACY WILL DISPENSE MEDICATIONS LISTED ON YOUR MEDICATION LIST TO YOU DURING YOUR ADMISSION [place]!    Patients discharged on the day of surgery will not be allowed to drive home.  Someone NEEDS to stay with you for the first 24 hours after anesthesia.   Special Instructions: Bring a copy of your healthcare power of attorney and living will documents the day of surgery if you haven't scanned them before.              Please read over the following fact sheets you were given: IF [place] [phone]   If you received a COVID test during your pre-op visit  it is requested that you wear a mask when out in public, stay away from anyone that may not be feeling well and notify your surgeon if you develop symptoms. If you  test positive for Covid or have been in contact with anyone that has tested positive in the last 10 days please notify you surgeon.

## 2022-02-16 NOTE — H&P (Signed)
Patient is a 66 year old white male who underwent recent bilateral ureteroscopy with removal of renal calculi by Dr. Percell Miller at [facility] urology on 02/09/2022. His preoperative scan showed a nonobstructing 6 mm stone in the right lower calyx and a 4 to 5 mm left renal calculus which was nonobstructing. There were no ureteral calculi noted but he did have distal dilation of both ureters near the bladder. Review of the operative note states that both stones were extracted after dilation of the ureter utilizing flexible scope without laser. Stent was placed on the left side but not on the right. Patient subsequently was instructed to self remove the stent 5 days after the procedure. He had severe pain on both sides prompting emergency room visit to the [facility], ER on 02/11/2022. CT urogram at that time shows what appears to be a 5 to 6 mm stone in the distal right ureter which does not seem to be obstructing. He continues to have ureteral dilation which is now more prominent in the proximal ureter than preoperative CT scan. Left side shows JJ stent in place in good position and no stones obvious. Patient currently has pain level 5-6 out of 10 he is denied any significant nausea or vomiting no fever. He continues to have some intermittent hematuria likely related to his stent.    CLINICAL DATA: Left-sided flank pain     EXAM:  CT ABDOMEN AND PELVIS WITHOUT CONTRAST     TECHNIQUE:  Multidetector CT imaging of the abdomen and pelvis was performed  following the standard protocol without IV contrast.     RADIATION DOSE REDUCTION: This exam was performed according to the  departmental dose-optimization program which includes automated  exposure control, adjustment of the mA and/or kV according to  patient size and/or use of iterative reconstruction technique.     COMPARISON: 01/01/2022     FINDINGS:  Lower chest: No acute abnormality.     Hepatobiliary: Fatty infiltration of the liver is noted.  The  gallbladder is within normal limits.     Pancreas: Unremarkable. No pancreatic ductal dilatation or  surrounding inflammatory changes.     Spleen: Normal in size without focal abnormality.     Adrenals/Urinary Tract: Adrenal glands are within normal limits.  Left kidney demonstrates a ureteral stent in satisfactory position  extending into the bladder. Previously seen left renal stone has  resolved following recent procedure. Air is noted in the collecting  systems bilaterally related to the recent procedure. Mild  hydronephrosis and hydroureter on the right is noted. No stent is  seen. A migrated stone from the right kidney now lies in the distal  right ureter. Compressed. Seen.     Stomach/Bowel: Diverticular change of the colon is noted. No  diverticulitis is seen. The appendix is not well visualized. No  inflammatory changes are seen. Small bowel and stomach are within  normal limits.     Vascular/Lymphatic: Aortic atherosclerosis. No enlarged abdominal or  pelvic lymph nodes.     Reproductive: Prostate is unremarkable.     Other: No abdominal wall hernia or abnormality. No abdominopelvic  ascites.     Musculoskeletal: No acute or significant osseous findings.     IMPRESSION:  Interval migration of previously seen 6 mm right renal stone into  the distal right ureter. Persistent dilatation of the right ureter  is noted.     Left ureteral stent is noted in satisfactory position. Previously  seen left renal stone has resolved following manipulation.  Diverticular change without diverticulitis.        Electronically Signed  By: Inez Catalina M.D.  On: 02/11/2022 03:02       ALLERGIES: Bee Venom Protein (Honey Bee) Codeine Ibuprofen NSAIDs Nuts Peanuts Soy Protein    MEDICATIONS: Simvastatin 80 mg tablet  Aspirin Ec 81 mg tablet, delayed release  Cephalexin 500 mg capsule 1 capsule PO QID  Epipen  Famotidine 20 mg tablet  Melatonin  Nebivolol Hcl  10 mg tablet  Omega 3  Telmisartan 80 mg tablet  Vitamin D3     GU PSH: Ureteroscopic stone removal, Right       PSH Notes: MOHS   NON-GU PSH: None   GU PMH: Renal calculus    NON-GU PMH: Diabetes Type 2 Hypercholesterolemia Hypertension    FAMILY HISTORY: No Family History    SOCIAL HISTORY: Marital Status: Married Ethnicity: Not Hispanic Or Latino; Race: White Current Smoking Status: Patient has never smoked.   Tobacco Use Assessment Completed: Used Tobacco in last 30 days? Does drink.     REVIEW OF SYSTEMS:    GU Review Male:   Patient reports burning/ pain with urination. Patient denies frequent urination, hard to postpone urination, get up at night to urinate, leakage of urine, stream starts and stops, trouble starting your stream, have to strain to urinate , erection problems, and penile pain.  Gastrointestinal (Upper):   Patient denies nausea, vomiting, and indigestion/ heartburn.  Gastrointestinal (Lower):   Patient denies diarrhea and constipation.  Constitutional:   Patient denies fever, night sweats, weight loss, and fatigue.  Skin:   Patient denies skin rash/ lesion and itching.  Eyes:   Patient denies double vision and blurred vision.  Ears/ Nose/ Throat:   Patient denies sore throat and sinus problems.  Hematologic/Lymphatic:   Patient denies swollen glands and easy bruising.  Cardiovascular:   Patient denies leg swelling and chest pains.  Respiratory:   Patient denies cough and shortness of breath.  Endocrine:   Patient denies excessive thirst.  Musculoskeletal:   Patient reports back pain. Patient denies joint pain.  Neurological:   Patient denies headaches and dizziness.  Psychologic:   Patient denies depression and anxiety.   VITAL SIGNS:      02/12/2022 10:55 AM  Weight 202 lb / 91.63 kg  Height 71 in / 180.34 cm  BP 131/82 mmHg  Pulse 61 /min  Temperature 98.4 F / 36.8 C  BMI 28.2 kg/m   GU PHYSICAL EXAMINATION:    Anus and Perineum: No  hemorrhoids. No anal stenosis. No rectal fissure, no anal fissure. No edema, no dimple, no perineal tenderness, no anal tenderness.  Scrotum: No lesions. No edema. No cysts. No warts.  Epididymides: Right: no spermatocele, no masses, no cysts, no tenderness, no induration, no enlargement. Left: no spermatocele, no masses, no cysts, no tenderness, no induration, no enlargement.  Testes: No tenderness, no swelling, no enlargement left testes. No tenderness, no swelling, no enlargement right testes. Normal location left testes. Normal location right testes. No mass, no cyst, no varicocele, no hydrocele left testes. No mass, no cyst, no varicocele, no hydrocele right testes.  Urethral Meatus: Normal size. No lesion, no wart, no discharge, no polyp. Normal location.  Penis: Circumcised, no warts, no cracks. No dorsal Peyronie's plaques, no left corporal Peyronie's plaques, no right corporal Peyronie's plaques, no scarring, no warts. No balanitis, no meatal stenosis.  Prostate: 40 gram or 2+ size. Left lobe normal consistency, right lobe normal consistency. Symmetrical lobes. No  prostate nodule. Left lobe no tenderness, right lobe no tenderness.  Seminal Vesicles: Nonpalpable.  Sphincter Tone: Normal sphincter. No rectal tenderness. No rectal mass.    MULTI-SYSTEM PHYSICAL EXAMINATION:    Constitutional: Well-nourished. No physical deformities. Normally developed. Good grooming.  Neck: Neck symmetrical, not swollen. Normal tracheal position.  Respiratory: No labored breathing, no use of accessory muscles.   Cardiovascular: Normal temperature, normal extremity pulses, no swelling, no varicosities.  Lymphatic: No enlargement of neck, axillae, groin.  Skin: No paleness, no jaundice, no cyanosis. No lesion, no ulcer, no rash.  Neurologic / Psychiatric: Oriented to time, oriented to place, oriented to person. No depression, no anxiety, no agitation.  Gastrointestinal: No mass, no tenderness, no rigidity, non  obese abdomen.  Eyes: Normal conjunctivae. Normal eyelids.  Ears, Nose, Mouth, and Throat: Left ear no scars, no lesions, no masses. Right ear no scars, no lesions, no masses. Nose no scars, no lesions, no masses. Normal hearing. Normal lips.  Musculoskeletal: Normal gait and station of head and neck.     Complexity of Data:  Source Of History:  Patient  Records Review:   Previous Doctor Records, Previous Hospital Records, Previous Patient Records  Urine Test Review:   Urinalysis  X-Ray Review: C.T. Abdomen/Pelvis: Reviewed Films. Reviewed Report. Discussed With Patient.     PROCEDURES:          Urinalysis w/Scope Dipstick Dipstick Cont'd Micro  Color: Orange Bilirubin: Invalid mg/dL WBC/hpf: 6 - 10/hpf  Appearance: Cloudy Ketones: Invalid mg/dL RBC/hpf: >60/hpf  Specific Gravity: Invalid Blood: Invalid ery/uL Bacteria: Mod (26-50/hpf)  pH: Invalid Protein: Invalid mg/dL Cystals: NS (Not Seen)  Glucose: Invalid mg/dL Urobilinogen: Invalid mg/dL Casts: NS (Not Seen)    Nitrites: Invalid Trichomonas: Not Present    Leukocyte Esterase: Invalid leu/uL Mucous: Not Present      Epithelial Cells: 0 - 5/hpf      Yeast: NS (Not Seen)      Sperm: Not Present    Notes: Micro only due to color interference    ASSESSMENT:      ICD-10 Details  1 GU:   Ureteral calculus - J49.7 Acute, Complicated Injury   PLAN:           Document Letter(s):  Created for Patient: Clinical Summary         Notes:   I reviewed CT scans in detail with the patient and showed him that appears to have a nonobstructing stone in the distal right ureter which appears to be the same size of the stone previously noted in the right kidney on preoperative CT scan. The op reports states that the stone was removed intact but this clearly was not present on prior CT scan. The patient is having moderate discomfort I think the majority is stent discomfort. We will plan to continue on tamsulosin 0.4 mg daily, I gave him some  samples of Gemtesa to use as well. Plan for cystoscopy right ureteroscopy and stone extraction next week. If he has worsening pain is intolerable nausea vomiting or fever will proceed earlier. Told patient he would likely need a stent on the right side if we get the stone out.  I have recommended retrograde pyelogram, ureteroscopic stone manipulation with laser lithotripsy. I have discussed in detail the risks, benefits and alternatives of ureteroscopic stone extraction to include but not limited to: Bleeding, infection, ureteral perforation with need for open repair, inability to place the stent necessitating the need for further procedures, possible percutaneous nephrostomy tube placement, discomfort  from the stents, hematuria, urgency, frequency and refractory problems after the stent is removed. I discussed the stent is not a permanent stent and will require a followup for stent removal or stent exchange. The patient knows there is high risk for ureteral stent incrustation if this is not removed or exchanged within 3 months. Patient voices understanding of the risks and benefits of the procedure and consents to the procedure.

## 2022-02-16 NOTE — Anesthesia Preprocedure Evaluation (Signed)
Anesthesia Evaluation  Patient identified by MRN, date of birth, ID band Patient awake    Reviewed: Allergy & Precautions, NPO status , Patient's Chart, lab work & pertinent test results  History of Anesthesia Complications (+) PONV and history of anesthetic complications  Airway Mallampati: II  TM Distance: >3 FB Neck ROM: Full    Dental no notable dental hx. (+) Teeth Intact, Dental Advisory Given   Pulmonary sleep apnea and Continuous Positive Airway Pressure Ventilation ,    Pulmonary exam normal breath sounds clear to auscultation       Cardiovascular hypertension, Normal cardiovascular exam Rhythm:Regular Rate:Normal     Neuro/Psych negative neurological ROS  negative psych ROS   GI/Hepatic Neg liver ROS, GERD  ,  Endo/Other  diabetes  Renal/GU Renal diseaseLab Results      Component                Value               Date                      CREATININE               1.65 (H)            02/11/2022              K                        3.8                 02/11/2022                    Musculoskeletal   Abdominal   Peds  Hematology Lab Results      Component                Value               Date                      WBC                      10.7 (H)            02/11/2022                HGB                      16.0                02/11/2022                HCT                      45.8                02/11/2022                MCV                      94.2                02/11/2022                PLT  180                 02/11/2022              Anesthesia Other Findings ALL: NSaids, ibuprofen  Reproductive/Obstetrics                           Anesthesia Physical Anesthesia Plan  ASA: 3  Anesthesia Plan: General   Post-op Pain Management: Ofirmev IV (intra-op)* and Toradol IV (intra-op)*   Induction: Intravenous  PONV Risk Score and Plan: 3 and Treatment  may vary due to age or medical condition, Midazolam and Ondansetron  Airway Management Planned: LMA  Additional Equipment: None  Intra-op Plan:   Post-operative Plan:   Informed Consent: I have reviewed the patients History and Physical, chart, labs and discussed the procedure including the risks, benefits and alternatives for the proposed anesthesia with the patient or authorized representative who has indicated his/her understanding and acceptance.     Dental advisory given  Plan Discussed with:   Anesthesia Plan Comments:        Anesthesia Quick Evaluation

## 2022-02-17 ENCOUNTER — Encounter (HOSPITAL_COMMUNITY)
Admission: RE | Disposition: A | Discharge status: Home / Self Care | Payer: Self-pay | Source: Ambulatory Visit | Attending: Urology

## 2022-02-17 ENCOUNTER — Other Ambulatory Visit: Payer: Self-pay

## 2022-02-17 ENCOUNTER — Ambulatory Visit (HOSPITAL_COMMUNITY): Payer: Medicare Other | Admitting: Emergency Medicine

## 2022-02-17 ENCOUNTER — Encounter (HOSPITAL_COMMUNITY): Payer: Self-pay | Admitting: Urology

## 2022-02-17 ENCOUNTER — Ambulatory Visit (HOSPITAL_COMMUNITY): Payer: Medicare Other

## 2022-02-17 ENCOUNTER — Ambulatory Visit (HOSPITAL_BASED_OUTPATIENT_CLINIC_OR_DEPARTMENT_OTHER): Payer: Medicare Other | Admitting: Emergency Medicine

## 2022-02-17 ENCOUNTER — Ambulatory Visit (HOSPITAL_COMMUNITY)
Admission: RE | Admit: 2022-02-17 | Discharge: 2022-02-17 | Disposition: A | Discharge status: Home / Self Care | Payer: Medicare Other | Source: Ambulatory Visit | Attending: Urology | Admitting: Urology

## 2022-02-17 DIAGNOSIS — I10 Essential (primary) hypertension: Secondary | ICD-10-CM

## 2022-02-17 DIAGNOSIS — N201 Calculus of ureter: Secondary | ICD-10-CM

## 2022-02-17 DIAGNOSIS — G4733 Obstructive sleep apnea (adult) (pediatric): Secondary | ICD-10-CM | POA: Diagnosis not present

## 2022-02-17 DIAGNOSIS — K219 Gastro-esophageal reflux disease without esophagitis: Secondary | ICD-10-CM | POA: Diagnosis not present

## 2022-02-17 DIAGNOSIS — E119 Type 2 diabetes mellitus without complications: Secondary | ICD-10-CM | POA: Diagnosis not present

## 2022-02-17 DIAGNOSIS — Z9989 Dependence on other enabling machines and devices: Secondary | ICD-10-CM | POA: Diagnosis not present

## 2022-02-17 DIAGNOSIS — G4730 Sleep apnea, unspecified: Secondary | ICD-10-CM | POA: Insufficient documentation

## 2022-02-17 DIAGNOSIS — N132 Hydronephrosis with renal and ureteral calculous obstruction: Secondary | ICD-10-CM | POA: Insufficient documentation

## 2022-02-17 HISTORY — DX: Fatty (change of) liver, not elsewhere classified: K76.0

## 2022-02-17 HISTORY — DX: Personal history of urinary calculi: Z87.442

## 2022-02-17 HISTORY — DX: Gastro-esophageal reflux disease without esophagitis: K21.9

## 2022-02-17 HISTORY — PX: STENT REMOVAL: SHX6421

## 2022-02-17 HISTORY — DX: Other specified postprocedural states: Z98.890

## 2022-02-17 HISTORY — DX: Unspecified malignant neoplasm of skin, unspecified: C44.90

## 2022-02-17 HISTORY — PX: CYSTOSCOPY/URETEROSCOPY/HOLMIUM LASER/STENT PLACEMENT: SHX6546

## 2022-02-17 HISTORY — DX: Type 2 diabetes mellitus without complications: E11.9

## 2022-02-17 LAB — GLUCOSE, CAPILLARY: Glucose-Capillary: 125 mg/dL — ABNORMAL HIGH (ref 70–99)

## 2022-02-17 SURGERY — CYSTOSCOPY/URETEROSCOPY/HOLMIUM LASER/STENT PLACEMENT
Anesthesia: General | Laterality: Right

## 2022-02-17 MED ORDER — ACETAMINOPHEN 10 MG/ML IV SOLN: 1000.0000 mg | Freq: Once | INTRAVENOUS | Status: DC | PRN

## 2022-02-17 MED ORDER — MIDAZOLAM HCL 2 MG/2ML IJ SOLN
INTRAMUSCULAR | Status: AC
  Filled 2022-02-17: qty 2

## 2022-02-17 MED ORDER — PROPOFOL 10 MG/ML IV BOLUS
INTRAVENOUS | Status: AC
  Filled 2022-02-17: qty 20

## 2022-02-17 MED ORDER — FENTANYL CITRATE (PF) 100 MCG/2ML IJ SOLN
INTRAMUSCULAR | Status: AC
  Filled 2022-02-17: qty 2

## 2022-02-17 MED ORDER — TRAMADOL HCL 50 MG PO TABS: 50.0000 mg | ORAL_TABLET | Freq: Four times a day (QID) | ORAL | 0 refills | Status: AC | PRN

## 2022-02-17 MED ORDER — ONDANSETRON HCL 4 MG/2ML IJ SOLN: 4.0000 mg | Freq: Once | INTRAMUSCULAR | Status: DC | PRN

## 2022-02-17 MED ORDER — ONDANSETRON HCL 4 MG/2ML IJ SOLN
INTRAMUSCULAR | Status: DC | PRN
  Administered 2022-02-17: 4 mg via INTRAVENOUS

## 2022-02-17 MED ORDER — PROPOFOL 10 MG/ML IV BOLUS
INTRAVENOUS | Status: DC | PRN
  Administered 2022-02-17: 140 mg via INTRAVENOUS

## 2022-02-17 MED ORDER — IOHEXOL 300 MG/ML  SOLN
INTRAMUSCULAR | Status: DC | PRN
  Administered 2022-02-17: 45 mL

## 2022-02-17 MED ORDER — CEFAZOLIN SODIUM-DEXTROSE 2-4 GM/100ML-% IV SOLN
2.0000 g | INTRAVENOUS | Status: AC
  Administered 2022-02-17: 2 g via INTRAVENOUS
  Filled 2022-02-17: qty 100

## 2022-02-17 MED ORDER — CHLORHEXIDINE GLUCONATE 0.12 % MT SOLN
15.0000 mL | Freq: Once | OROMUCOSAL | Status: AC
  Administered 2022-02-17: 15 mL via OROMUCOSAL

## 2022-02-17 MED ORDER — ORAL CARE MOUTH RINSE: 15.0000 mL | Freq: Once | OROMUCOSAL | Status: AC

## 2022-02-17 MED ORDER — PROPOFOL 1000 MG/100ML IV EMUL
INTRAVENOUS | Status: AC
  Filled 2022-02-17: qty 100

## 2022-02-17 MED ORDER — ONDANSETRON HCL 4 MG/2ML IJ SOLN
INTRAMUSCULAR | Status: AC
  Filled 2022-02-17: qty 2

## 2022-02-17 MED ORDER — DEXAMETHASONE SODIUM PHOSPHATE 10 MG/ML IJ SOLN
INTRAMUSCULAR | Status: DC | PRN
  Administered 2022-02-17: 10 mg via INTRAVENOUS

## 2022-02-17 MED ORDER — ACETAMINOPHEN 10 MG/ML IV SOLN
INTRAVENOUS | Status: AC
  Filled 2022-02-17: qty 100

## 2022-02-17 MED ORDER — FENTANYL CITRATE PF 50 MCG/ML IJ SOSY: 25.0000 ug | PREFILLED_SYRINGE | INTRAMUSCULAR | Status: DC | PRN

## 2022-02-17 MED ORDER — MIDAZOLAM HCL 5 MG/5ML IJ SOLN
INTRAMUSCULAR | Status: DC | PRN
  Administered 2022-02-17: 2 mg via INTRAVENOUS

## 2022-02-17 MED ORDER — PROPOFOL 500 MG/50ML IV EMUL
INTRAVENOUS | Status: DC | PRN
  Administered 2022-02-17: 150 ug/kg/min via INTRAVENOUS

## 2022-02-17 MED ORDER — DEXAMETHASONE SODIUM PHOSPHATE 10 MG/ML IJ SOLN
INTRAMUSCULAR | Status: AC
  Filled 2022-02-17: qty 1

## 2022-02-17 MED ORDER — LACTATED RINGERS IV SOLN: INTRAVENOUS | Status: DC

## 2022-02-17 MED ORDER — LIDOCAINE 2% (20 MG/ML) 5 ML SYRINGE
INTRAMUSCULAR | Status: DC | PRN
  Administered 2022-02-17: 100 mg via INTRAVENOUS

## 2022-02-17 MED ORDER — 0.9 % SODIUM CHLORIDE (POUR BTL) OPTIME
TOPICAL | Status: DC | PRN
  Administered 2022-02-17: 1000 mL

## 2022-02-17 MED ORDER — DEXMEDETOMIDINE HCL IN NACL 80 MCG/20ML IV SOLN
INTRAVENOUS | Status: DC | PRN
  Administered 2022-02-17: 12 ug via BUCCAL

## 2022-02-17 MED ORDER — FENTANYL CITRATE (PF) 100 MCG/2ML IJ SOLN
INTRAMUSCULAR | Status: DC | PRN
  Administered 2022-02-17: 50 ug via INTRAVENOUS
  Administered 2022-02-17 (×2): 25 ug via INTRAVENOUS

## 2022-02-17 MED ORDER — ACETAMINOPHEN 10 MG/ML IV SOLN
INTRAVENOUS | Status: DC | PRN
  Administered 2022-02-17: 1000 mg via INTRAVENOUS

## 2022-02-17 MED ORDER — SODIUM CHLORIDE 0.9 % IR SOLN
Status: DC | PRN
  Administered 2022-02-17: 6000 mL via INTRAVESICAL

## 2022-02-17 SURGICAL SUPPLY — 23 items
BAG URO CATCHER STRL LF (MISCELLANEOUS) ×2 IMPLANT
BASKET ZERO TIP NITINOL 2.4FR (BASKET) IMPLANT
BSKT STON RTRVL ZERO TP 2.4FR (BASKET)
BULB IRRIG PATHFIND (MISCELLANEOUS) ×2 IMPLANT
CATH URETL OPEN 5X70 (CATHETERS) IMPLANT
CLOTH BEACON ORANGE TIMEOUT ST (SAFETY) ×2 IMPLANT
EXTRACTOR STONE 1.7FRX115CM (UROLOGICAL SUPPLIES) IMPLANT
EXTRACTOR STONE NITINOL NGAGE (UROLOGICAL SUPPLIES) IMPLANT
GLOVE SURG LX STRL 7.5 STRW (GLOVE) ×2 IMPLANT
GOWN STRL REUS W/ TWL XL LVL3 (GOWN DISPOSABLE) ×2 IMPLANT
GOWN STRL REUS W/TWL XL LVL3 (GOWN DISPOSABLE) ×2
GUIDEWIRE ANG ZIPWIRE 038X150 (WIRE) IMPLANT
GUIDEWIRE STR DUAL SENSOR (WIRE) ×2 IMPLANT
KIT TURNOVER KIT A (KITS) IMPLANT
LASER FIB FLEXIVA PULSE ID 365 (Laser) IMPLANT
MANIFOLD NEPTUNE II (INSTRUMENTS) ×2 IMPLANT
PACK CYSTO (CUSTOM PROCEDURE TRAY) ×2 IMPLANT
SHEATH NAVIGATOR HD 11/13X36 (SHEATH) IMPLANT
SYR 20ML LL LF (SYRINGE) ×2 IMPLANT
TRACTIP FLEXIVA PULS ID 200XHI (Laser) IMPLANT
TRACTIP FLEXIVA PULSE ID 200 (Laser)
TUBING CONNECTING 10 (TUBING) ×2 IMPLANT
TUBING UROLOGY SET (TUBING) ×2 IMPLANT

## 2022-02-17 NOTE — Anesthesia Procedure Notes (Addendum)
Procedure Name: LMA Insertion Date/Time: 02/17/2022 1:17 PM  Performed by: West Pugh, CRNAPre-anesthesia Checklist: Patient identified, Emergency Drugs available, Suction available, Patient being monitored and Timeout performed Patient Re-evaluated:Patient Re-evaluated prior to induction Oxygen Delivery Method: Circle system utilized Preoxygenation: Pre-oxygenation with 100% oxygen Induction Type: IV induction LMA: LMA with gastric port inserted LMA Size: 4.0 Number of attempts: 1 Placement Confirmation: positive ETCO2 Tube secured with: Tape Dental Injury: Teeth and Oropharynx as per pre-operative assessment

## 2022-02-17 NOTE — Anesthesia Postprocedure Evaluation (Signed)
Anesthesia Post Note  Patient: Antonio Houston  Procedure(s) Performed: CYSTOSCOPY/URETEROSCOPY/HOLMIUM LASER/STENT PLACEMENT, STONE EXTRACTION, BILATERAL RETROGRADE (Right) URETERAL STENT REMOVAL (Left)     Patient location during evaluation: PACU Anesthesia Type: General Level of consciousness: awake and alert Pain management: pain level controlled Vital Signs Assessment: post-procedure vital signs reviewed and stable Respiratory status: spontaneous breathing, nonlabored ventilation, respiratory function stable and patient connected to nasal cannula oxygen Cardiovascular status: blood pressure returned to baseline and stable Postop Assessment: no apparent nausea or vomiting Anesthetic complications: no   No notable events documented.  Last Vitals:  Vitals:   02/17/22 1445 02/17/22 1500  BP: 117/77 122/65  Pulse: (!) 57 (!) 55  Resp: 20 19  Temp:  36.4 C  SpO2: 94% 94%    Last Pain:  Vitals:   02/17/22 1500  TempSrc:   PainSc: 0-No pain                 Barnet Glasgow

## 2022-02-17 NOTE — Interval H&P Note (Signed)
History and Physical Interval Note:  02/17/2022 12:29 PM  Antonio Houston  has presented today for surgery, with the diagnosis of URETERAL CALCULUS.  The various methods of treatment have been discussed with the patient and family. After consideration of risks, benefits and other options for treatment, the patient has consented to  Procedure(s) with comments: CYSTOSCOPY/URETEROSCOPY/HOLMIUM LASER/STENT PLACEMENT (Right) - 30 MINS as a surgical intervention.  The patient's history has been reviewed, patient examined, no change in status, stable for surgery.  I have reviewed the patient's chart and labs.  Questions were answered to the patient's satisfaction.     Remi Haggard

## 2022-02-17 NOTE — Transfer of Care (Signed)
Immediate Anesthesia Transfer of Care Note  Patient: Shelia Media III  Procedure(s) Performed: CYSTOSCOPY/URETEROSCOPY/HOLMIUM LASER/STENT PLACEMENT, STONE EXTRACTION, BILATERAL RETROGRADE (Right) URETERAL STENT REMOVAL (Left)  Patient Location: PACU  Anesthesia Type:General  Level of Consciousness: awake, alert  and patient cooperative  Airway & Oxygen Therapy: Patient Spontanous Breathing and Patient connected to face mask oxygen  Post-op Assessment: Report given to RN and Post -op Vital signs reviewed and stable  Post vital signs: Reviewed and stable  Last Vitals:  Vitals Value Taken Time  BP    Temp    Pulse    Resp    SpO2 98 % 02/17/22 1412  Vitals shown include unvalidated device data.  Last Pain:  Vitals:   02/17/22 1125  TempSrc: Oral         Complications: No notable events documented.

## 2022-02-17 NOTE — Op Note (Signed)
Preoperative diagnosis:  1.  Right distal ureteral calculus  Postoperative diagnosis: 1.  Right distal ureteral calculus  Procedure(s): 1.  Cystoscopy, bilateral retrograde pyelogram with intraoperative interpretation, right ureteroscopy with laser lithotripsy of right distal ureteral calculus, stone extraction, insertion right JJ stent, removal left JJ stent.  Tether was left on right JJ stent protruding to the urethral meatus to facilitate future removal.  Surgeon: Dr. Harold Barban  Anesthesia: General  Complications: None none  EBL: Minimal  Specimens: Kidney stone fragments  Disposition of specimens: With patient  Intraoperative findings: Patient had 8 mm right distal ureteral calculus.  Narrowed right ureteral orifice.  Stone was fragmented and completely extracted.  No evidence of remaining stone on the left left JJ stent was removed.  6 [place] by 26 cm Percuflex plus soft contour stent placed on the right  Indication: 66 year old white male who underwent previous ureter logical procedure with ureteroscopy on both sides with support and stone extraction.  He has a unilateral left JJ stent in place following this procedure proximally 1 week ago.  He presented to the ER with right-sided flank pain CT urogram confirmed persistent stone fragment in the right distal ureter presents this time and get cystoscopy right ureteroscopy laser lithotripsy and probable removal of left JJ stent.  Description of procedure:  After obtaining informed consent for the patient he was taken major cystoscopy suite placed under general anesthesia.  Placed in the dorsolithotomy position genitalia prepped and draped in usual sterile fashion.  Proper pause timeout was performed.  12 French cystoscope was advanced into the bladder with above-noted findings.  Left JJ stent was noted to be protruding from the left ureteral orifice.  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  The x-ray contrast did empty out over time although there was some delay due to the dilated left ureter.  Attention was then directed towards the right side.  Retrograde pyelogram performed with 5 French open tip catheter confirming again distal right ureteral dilation and probable filling defect in the distal ureter.  A sensor wire was passed up to the right renal pelvis under fluoroscopy.  Open tip catheter removed.  6.4 French semirigid ureteroscope was advanced over the guidewire and inside the right ureteral orifice where the stone was noted in the dilated right ureter.  The 200 m holmium laser fiber was then utilized to fragment the stone into 3 separate fragments utilizing a power of 0.6 and rate of 6.  The 3 fragments were then extracted with separate passes of the ureteroscope.  It was somewhat tight and the ureteral orifice due to previous edema and it was felt that a right JJ stent would be indicated.  Ureteroscope was passed back in the ureter ureter after extracting the fragments and advanced along its length with no remaining fragments seen along the length.  Retrograde pyelogram through the scope revealed good integrity of the collecting system and ureter without extravasation of contrast.  Sensor wire was already in place sensor wire was already in place and ureteroscope was backed out leaving the wire in place.  The wire was then back fed through the cystoscope and a 6 [place] by 26 cm Percuflex plus soft contour stent was placed leaving a proximal coil in the renal pelvis and distal coil in the bladder.  There is good flow of urine through and  around the stent noted.  Nylon suture was left on the distal end of the stent left protruding to the urethral meatus to facilitate future removal.  The stone fragments were irrigated from the bladder and collected for analysis.  Procedure  terminated he was awakened from anesthesia and taken back to the recovery in stable condition.  No immediate complication from the procedure.

## 2022-02-18 ENCOUNTER — Encounter (HOSPITAL_COMMUNITY): Payer: Self-pay | Admitting: Urology

## 2022-05-07 ENCOUNTER — Emergency Department (HOSPITAL_BASED_OUTPATIENT_CLINIC_OR_DEPARTMENT_OTHER): Payer: Medicare Other

## 2022-05-07 ENCOUNTER — Encounter (HOSPITAL_BASED_OUTPATIENT_CLINIC_OR_DEPARTMENT_OTHER): Payer: Self-pay | Admitting: Emergency Medicine

## 2022-05-07 ENCOUNTER — Emergency Department (HOSPITAL_BASED_OUTPATIENT_CLINIC_OR_DEPARTMENT_OTHER)
Admission: EM | Admit: 2022-05-07 | Discharge: 2022-05-07 | Disposition: A | Discharge status: Home / Self Care | Payer: Medicare Other | Attending: Emergency Medicine | Admitting: Emergency Medicine

## 2022-05-07 ENCOUNTER — Other Ambulatory Visit: Payer: Self-pay

## 2022-05-07 DIAGNOSIS — Z91010 Allergy to peanuts: Secondary | ICD-10-CM | POA: Insufficient documentation

## 2022-05-07 DIAGNOSIS — Z79899 Other long term (current) drug therapy: Secondary | ICD-10-CM | POA: Diagnosis not present

## 2022-05-07 DIAGNOSIS — R1032 Left lower quadrant pain: Secondary | ICD-10-CM | POA: Diagnosis present

## 2022-05-07 DIAGNOSIS — K5732 Diverticulitis of large intestine without perforation or abscess without bleeding: Secondary | ICD-10-CM | POA: Diagnosis not present

## 2022-05-07 DIAGNOSIS — E119 Type 2 diabetes mellitus without complications: Secondary | ICD-10-CM | POA: Diagnosis not present

## 2022-05-07 DIAGNOSIS — D72829 Elevated white blood cell count, unspecified: Secondary | ICD-10-CM | POA: Diagnosis not present

## 2022-05-07 DIAGNOSIS — Z7982 Long term (current) use of aspirin: Secondary | ICD-10-CM | POA: Insufficient documentation

## 2022-05-07 DIAGNOSIS — I10 Essential (primary) hypertension: Secondary | ICD-10-CM | POA: Insufficient documentation

## 2022-05-07 DIAGNOSIS — K5792 Diverticulitis of intestine, part unspecified, without perforation or abscess without bleeding: Secondary | ICD-10-CM

## 2022-05-07 LAB — CBC WITH DIFFERENTIAL/PLATELET
Abs Immature Granulocytes: 0.04 10*3/uL (ref 0.00–0.07)
Basophils Absolute: 0 10*3/uL (ref 0.0–0.1)
Basophils Relative: 0 %
Eosinophils Absolute: 0.1 10*3/uL (ref 0.0–0.5)
Eosinophils Relative: 1 %
HCT: 44.4 % (ref 39.0–52.0)
Hemoglobin: 15.4 g/dL (ref 13.0–17.0)
Immature Granulocytes: 0 %
Lymphocytes Relative: 12 %
Lymphs Abs: 1.7 10*3/uL (ref 0.7–4.0)
MCH: 31.9 pg (ref 26.0–34.0)
MCHC: 34.7 g/dL (ref 30.0–36.0)
MCV: 91.9 fL (ref 80.0–100.0)
Monocytes Absolute: 1.1 10*3/uL — ABNORMAL HIGH (ref 0.1–1.0)
Monocytes Relative: 8 %
Neutro Abs: 11.2 10*3/uL — ABNORMAL HIGH (ref 1.7–7.7)
Neutrophils Relative %: 79 %
Platelets: 169 10*3/uL (ref 150–400)
RBC: 4.83 MIL/uL (ref 4.22–5.81)
RDW: 12.3 % (ref 11.5–15.5)
WBC: 14.2 10*3/uL — ABNORMAL HIGH (ref 4.0–10.5)
nRBC: 0 % (ref 0.0–0.2)

## 2022-05-07 LAB — COMPREHENSIVE METABOLIC PANEL
ALT: 34 U/L (ref 0–44)
AST: 17 U/L (ref 15–41)
Albumin: 4.6 g/dL (ref 3.5–5.0)
Alkaline Phosphatase: 52 U/L (ref 38–126)
Anion gap: 10 (ref 5–15)
BUN: 17 mg/dL (ref 8–23)
CO2: 26 mmol/L (ref 22–32)
Calcium: 10.2 mg/dL (ref 8.9–10.3)
Chloride: 104 mmol/L (ref 98–111)
Creatinine, Ser: 0.82 mg/dL (ref 0.61–1.24)
GFR, Estimated: >60 mL/min (ref >60)
Glucose, Bld: 112 mg/dL — ABNORMAL HIGH (ref 70–99)
Potassium: 3.8 mmol/L (ref 3.5–5.1)
Sodium: 140 mmol/L (ref 135–145)
Total Bilirubin: 0.7 mg/dL (ref 0.3–1.2)
Total Protein: 7.4 g/dL (ref 6.5–8.1)

## 2022-05-07 MED ORDER — IOHEXOL 300 MG/ML  SOLN
100.0000 mL | Freq: Once | INTRAMUSCULAR | Status: AC | PRN
  Administered 2022-05-07: 100 mL via INTRAVENOUS

## 2022-05-07 MED ORDER — AMOXICILLIN-POT CLAVULANATE 875-125 MG PO TABS: 1.0000 | ORAL_TABLET | Freq: Two times a day (BID) | ORAL | 0 refills | Status: AC

## 2022-05-07 MED ORDER — AMOXICILLIN-POT CLAVULANATE 875-125 MG PO TABS
1.0000 | ORAL_TABLET | Freq: Once | ORAL | Status: DC
  Filled 2022-05-07: qty 1

## 2022-05-07 NOTE — ED Notes (Signed)
Dc instructions reviewed with patient. Patient voiced understanding. Dc with belongings.  °

## 2022-05-07 NOTE — ED Provider Notes (Signed)
[facility] Provider Note   CSN: [id number] Arrival date & time: 05/07/22  1415     History  Chief Complaint  Patient presents with   Abdominal Pain    [facility] is a 67 y.o. male.  Patient is a 67 year old male with a history of hypertension, diabetes, kidney stones with 2 recent surgeries with lithotripsy and stenting due to stones in both kidneys that has all finally resolved about a month ago who presents today with about 3 days of gradually worsening abdominal pain.  He reports that it started as a mild cramp and ache and felt like he needed to have a bowel movement.  The pain has been located in the left lower quadrant the entire time.  When his bladder is full the pain is slightly worse but urinating does not affect the pain, eating does not affect the pain.  Bowel movements do not change the pain.  It has been gradually worsening.  He denies any nausea vomiting or fevers.  Reports that this feels different than the kidney stones he had in the past.  Patient went to his PCP yesterday and reported he had a full exam.  He also requested that they checked his urine and he reports that the urine yesterday was normal in the office.  He went to urgent care today because the pain was worsening and they sent him here for further evaluation.  The history is provided by the patient.  Abdominal Pain      Home Medications Prior to Admission medications   Medication Sig Start Date End Date Taking? Authorizing Provider  amoxicillin-clavulanate (AUGMENTIN) 875-125 MG tablet Take 1 tablet by mouth every 12 (twelve) hours. 05/07/22  Yes Marchelle Rinella, Loree Fee, MD  ALPRAZolam Duanne Moron) 1 MG tablet Take 1 tablet by mouth at bedtime. 10/20/18   [provider]  amLODipine (NORVASC) 5 MG tablet Take 5 mg by mouth in the morning. 10/23/21   [provider]  aspirin EC 81 MG tablet Take 81 mg by mouth every morning.    [provider]  EPINEPHrine  (EPIPEN 2-PAK) 0.3 mg/0.3 mL IJ SOAJ injection Inject 0.3 mg into the muscle as needed for anaphylaxis. 01/01/22   Orpah Greek, MD  Nebivolol HCl 20 MG TABS Take 20 mg by mouth in the morning.    [provider]  Omega-3 Fatty Acids (OMEGA-3 EPA FISH OIL PO) Take 1,000 mg by mouth daily.    [provider]  omeprazole (PRILOSEC) 40 MG capsule Take 40 mg by mouth every evening. 12/29/21   [provider]  oxyCODONE (ROXICODONE) 5 MG immediate release tablet Take 1 tablet (5 mg total) by mouth every 4 (four) hours as needed for severe pain. Patient not taking: Reported on 02/13/2022 02/11/22   Maudie Flakes, MD  Probiotic Product (ALIGN PO) Take 1 capsule by mouth in the morning.    [provider]  rosuvastatin (CRESTOR) 40 MG tablet Take 40 mg by mouth in the morning.    [provider]  sodium chloride (MURO 128) 2 % ophthalmic solution Place 1 drop into both eyes 3 (three) times daily as needed for eye irritation.    [provider]  telmisartan-hydrochlorothiazide (MICARDIS HCT) 80-25 MG tablet Take 1 tablet by mouth in the morning.    [provider]  traMADol (ULTRAM) 50 MG tablet Take 50 mg by mouth daily at 12 noon. 02/10/22   [provider]  traMADol (ULTRAM) 50 MG  tablet Take 1 tablet (50 mg total) by mouth every 6 (six) hours as needed. 02/17/22 02/17/23  Remi Haggard, MD  Vibegron (GEMTESA) 75 MG TABS Take 75 mg by mouth in the morning.    [provider]  XIGDUO XR 01-999 MG TB24 Take 1 tablet by mouth in the morning.    [provider]      Allergies    Ibuprofen, Nsaids, Peanut oil, Peanut-containing drug products, Bee venom, Other, and Codeine    Review of Systems   Review of Systems  Gastrointestinal:  Positive for abdominal pain.    Physical Exam Updated Vital Signs BP 133/79   Pulse 62   Temp 98.2 F (36.8 C) (Oral)   Resp (!) 26   SpO2 94%  Physical  Exam Vitals and nursing note reviewed.  Constitutional:      General: He is not in acute distress.    Appearance: He is well-developed.  HENT:     Head: Normocephalic and atraumatic.  Eyes:     Conjunctiva/sclera: Conjunctivae normal.     Pupils: Pupils are equal, round, and reactive to light.  Cardiovascular:     Rate and Rhythm: Normal rate and regular rhythm.     Heart sounds: No murmur heard. Pulmonary:     Effort: Pulmonary effort is normal. No respiratory distress.     Breath sounds: Normal breath sounds. No wheezing or rales.  Abdominal:     General: There is no distension.     Palpations: Abdomen is soft.     Tenderness: There is abdominal tenderness in the left lower quadrant. There is guarding. There is no right CVA tenderness, left CVA tenderness or rebound.  Musculoskeletal:        General: No tenderness. Normal range of motion.     Cervical back: Normal range of motion and neck supple.  Skin:    General: Skin is warm and dry.     Findings: No erythema or rash.  Neurological:     Mental Status: He is alert and oriented to person, place, and time.  Psychiatric:        Behavior: Behavior normal.     ED Results / Procedures / Treatments   Labs (all labs ordered are listed, but only abnormal results are displayed) Labs Reviewed  CBC WITH DIFFERENTIAL/PLATELET - Abnormal; Notable for the following components:      Result Value   WBC 14.2 (*)    Neutro Abs 11.2 (*)    Monocytes Absolute 1.1 (*)    All other components within normal limits  COMPREHENSIVE METABOLIC PANEL - Abnormal; Notable for the following components:   Glucose, Bld 112 (*)    All other components within normal limits    EKG None  Radiology CT ABDOMEN PELVIS W CONTRAST  Result Date: 05/07/2022 CLINICAL DATA:  Left lower quadrant abdominal pain EXAM: CT ABDOMEN AND PELVIS WITH CONTRAST TECHNIQUE: Multidetector CT imaging of the abdomen and pelvis was performed using the standard protocol  following bolus administration of intravenous contrast. RADIATION DOSE REDUCTION: This exam was performed according to the departmental dose-optimization program which includes automated exposure control, adjustment of the mA and/or kV according to patient size and/or use of iterative reconstruction technique. CONTRAST:  169m OMNIPAQUE IOHEXOL 300 MG/ML  SOLN COMPARISON:  CT abdomen pelvis 02/11/2022 FINDINGS: Lower chest: No acute abnormality. Hepatobiliary: Diffuse hepatic steatosis. No focal liver abnormality is seen. Normal appearance of the gallbladder. No intra or extrahepatic biliary duct dilation. Pancreas: Unremarkable. No  pancreatic ductal dilatation or surrounding inflammatory changes. Spleen: Normal in size without focal abnormality. Adrenals/Urinary Tract: Adrenal glands are unremarkable. No renal calculi. No renal mass. Diffuse dilation of the bilateral ureters. Urinary bladder is unremarkable. Stomach/Bowel: Stomach is within normal limits. Appendix not visualized. Numerous colonic diverticula with bowel wall thickening and pericolonic fat stranding in the sigmoid colon. No pericolonic abscess or free air to suggest perforation. No evidence of bowel obstruction. Vascular/Lymphatic: Aortic atherosclerosis. No enlarged abdominal or pelvic lymph nodes. Reproductive: Prostate is unremarkable. Other: No abdominal wall hernia or abnormality. No abdominopelvic ascites. Musculoskeletal: No acute or significant osseous findings. IMPRESSION: 1. Acute uncomplicated sigmoid diverticulitis. 2. Diffuse hepatic steatosis. 3. Diffuse dilation of the bilateral ureters, no obstructing calculi. Aortic Atherosclerosis (ICD10-I70.0). Electronically Signed   By: Audie Pinto M.D.   On: 05/07/2022 17:22    Procedures Procedures    Medications Ordered in ED Medications  amoxicillin-clavulanate (AUGMENTIN) 875-125 MG per tablet 1 tablet (has no administration in time range)  iohexol (OMNIPAQUE) 300 MG/ML  solution 100 mL (100 mLs Intravenous Contrast Given 05/07/22 1705)    ED Course/ Medical Decision Making/ A&P                             Medical Decision Making Amount and/or Complexity of Data Reviewed Labs: ordered. Decision-making details documented in ED Course. Radiology: ordered and independent interpretation performed. Decision-making details documented in ED Course.  Risk Prescription drug management.   Pt with multiple medical problems and comorbidities and presenting today with a complaint that caries a high risk for morbidity and mortality.  Here today with left lower quadrant pain.  Concern for recurrent kidney stone versus diverticulitis versus mass.  No evidence of shingles, hernia.  Patient is well-appearing.  Vital signs are stable.  Labs and imaging are pending.  5:50 PM I independently interpreted patient's labs and CBC with leukocytosis of 14, normal hemoglobin and platelet count, CMP within normal limits.  I have independently visualized and interpreted pt's images today.  CT today showing diverticulitis.  Radiology reports acute uncomplicated sigmoid diverticulitis patient is otherwise well-appearing and is stable for discharge home.  Will start Augmentin.  Given follow-up precautions.         Final Clinical Impression(s) / ED Diagnoses Final diagnoses:  Diverticulitis    Rx / DC Orders ED Discharge Orders          Ordered    amoxicillin-clavulanate (AUGMENTIN) 875-125 MG tablet  Every 12 hours        05/07/22 1748              Blanchie Dessert, MD 05/07/22 1750

## 2022-05-07 NOTE — Discharge Instructions (Addendum)
Make sure you are pooping every day and if not you need to use a stool softener to make sure you are going regularly.  If you start having fever, worsening pain, vomiting or other concerns return to the emergency room.  Start taking your antibiotic in the morning.

## 2022-05-07 NOTE — ED Triage Notes (Signed)
Left lower abdomen tenderness, 2 -3 days ago. Rec come here to rule out diverticulitis.

## 2022-05-07 NOTE — ED Triage Notes (Signed)
Pt had labs and ua at his MD yesterday

## 2022-06-20 ENCOUNTER — Ambulatory Visit
Admission: EM | Admit: 2022-06-20 | Discharge: 2022-06-20 | Disposition: A | Discharge status: Home / Self Care | Payer: Medicare Other | Attending: Nurse Practitioner | Admitting: Nurse Practitioner

## 2022-06-20 DIAGNOSIS — J069 Acute upper respiratory infection, unspecified: Secondary | ICD-10-CM | POA: Diagnosis not present

## 2022-06-20 MED ORDER — PROMETHAZINE-DM 6.25-15 MG/5ML PO SYRP: 5.0000 mL | ORAL_SOLUTION | Freq: Four times a day (QID) | ORAL | 0 refills | Status: AC | PRN

## 2022-06-20 MED ORDER — FLUTICASONE PROPIONATE 50 MCG/ACT NA SUSP: 2.0000 | Freq: Every day | NASAL | 0 refills | Status: AC

## 2022-06-20 NOTE — Discharge Instructions (Addendum)
Take medication as prescribed. Increase fluids and allow for plenty of rest. Warm salt water gargles 3-4 times daily to help with throat pain or discomfort. Recommend using a humidifier at bedtime during sleep to help with cough and nasal congestion. Sleep elevated on 2 pillows. If your symptoms do not improve within the next 5 to 7 days, or suddenly worsen, please follow-up in this clinic or with your primary care physician for further evaluation. Follow-up as needed.

## 2022-06-20 NOTE — ED Provider Notes (Signed)
[facility]    CSN: [id number] Arrival date & time: 06/20/22  1405      History   Chief Complaint Chief Complaint  Patient presents with   Cough    HPI [facility] is a 67 y.o. male.   The history is provided by the patient.   The patient presents for complaints of cough, nasal congestion, and wheezing that been present for the past 6 days.  Patient denies fever, chills, sore throat, headache, shortness of breath, difficulty breathing, abdominal pain, nausea, vomiting, or diarrhea.  Patient states that his cough was so bad last evening, he was coughing up phlegm.  He states that he does have difficulty wearing his CPAP and has had to sleep upright due to the phlegm.  He is reports he has been taking Mucinex which gives him some relief.  His wife also has the same or similar symptoms.  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   Allergic reaction to insect sting 04/04/2018   Current use of beta blocker 04/04/2018   Personal history of colonic polyps     Past Surgical History:  Procedure Laterality Date   COLONOSCOPY N/A 03/28/2018   Procedure: COLONOSCOPY;  Surgeon: Danie Binder, MD;  Location: [facility];  Service: Endoscopy;  Laterality: N/A;  10:30   CYSTOSCOPY/URETEROSCOPY/HOLMIUM LASER/STENT PLACEMENT Right 02/17/2022   Procedure: CYSTOSCOPY/URETEROSCOPY/HOLMIUM LASER/STENT PLACEMENT, STONE EXTRACTION, BILATERAL RETROGRADE;  Surgeon: Remi Haggard, MD;  Location: [facility];  Service: Urology;  Laterality: Right;  30 MINS    ESOPHAGOGASTRODUODENOSCOPY ENDOSCOPY     POLYPECTOMY  03/28/2018   Procedure: POLYPECTOMY;  Surgeon: Danie Binder, MD;  Location: [facility];  Service: Endoscopy;;  ascending colon polyp, transverse polyp, rectal polyps x2   renal stone removal  02/09/2022   [facility]   STENT REMOVAL Left 02/17/2022   Procedure: URETERAL STENT REMOVAL;  Surgeon: Remi Haggard, MD;  Location: [facility];  Service: Urology;  Laterality: Left;   TONSILLECTOMY         Home Medications    Prior to Admission medications   Medication Sig Start Date End Date Taking? Authorizing Provider  fluticasone (FLONASE) 50 MCG/ACT nasal spray Place 2 sprays into both nostrils daily. 06/20/22  Yes Ethleen Lormand-Warren, Alda Lea, NP  promethazine-dextromethorphan (PROMETHAZINE-DM) 6.25-15 MG/5ML syrup Take 5 mLs by mouth 4 (four) times daily as needed for cough. 06/20/22  Yes Delicia Berens-Warren, Alda Lea, NP  ALPRAZolam Duanne Moron) 1 MG tablet Take 1 tablet by mouth at bedtime. 10/20/18   [provider]  amLODipine (NORVASC) 5 MG tablet Take 5 mg by mouth in the morning. 10/23/21   [provider]  amoxicillin-clavulanate (AUGMENTIN) 875-125 MG tablet Take 1 tablet by mouth every 12 (twelve) hours. 05/07/22   Blanchie Dessert, MD  aspirin EC 81 MG tablet Take 81 mg by mouth every morning.    [provider]  EPINEPHrine (EPIPEN 2-PAK) 0.3 mg/0.3 mL IJ SOAJ injection Inject 0.3 mg into  the muscle as needed for anaphylaxis. 01/01/22   Orpah Greek, MD  Nebivolol HCl 20 MG TABS Take 20 mg by mouth in the morning.    [provider]  Omega-3 Fatty Acids (OMEGA-3 EPA FISH OIL PO) Take 1,000 mg by mouth daily.    [provider]  omeprazole (PRILOSEC) 40 MG capsule Take 40 mg by mouth every evening. 12/29/21   [provider]  oxyCODONE (ROXICODONE) 5 MG immediate release tablet Take 1 tablet (5 mg total) by mouth every 4 (four) hours as needed for severe pain. Patient not taking:  Reported on 02/13/2022 02/11/22   Maudie Flakes, MD  Probiotic Product (ALIGN PO) Take 1 capsule by mouth in the morning.    [provider]  rosuvastatin (CRESTOR) 40 MG tablet Take 40 mg by mouth in the morning.    [provider]  sodium chloride (MURO 128) 2 % ophthalmic solution Place 1 drop into both eyes 3 (three) times daily as needed for eye irritation.    [provider]  telmisartan-hydrochlorothiazide (MICARDIS HCT) 80-25 MG tablet Take 1 tablet by mouth in the morning.    [provider]  traMADol (ULTRAM) 50 MG tablet Take 50 mg by mouth daily at 12 noon. 02/10/22   [provider]  traMADol (ULTRAM) 50 MG tablet Take 1 tablet (50 mg total) by mouth every 6 (six) hours as needed. 02/17/22 02/17/23  Remi Haggard, MD  Vibegron (GEMTESA) 75 MG TABS Take 75 mg by mouth in the morning.    [provider]  XIGDUO XR 01-999 MG TB24 Take 1 tablet by mouth in the morning.    [provider]    Family History Family History  Problem Relation Age of Onset   Diabetes Mother    Diabetes Father    Allergic rhinitis Neg Hx    Asthma Neg Hx    Eczema Neg Hx    Urticaria Neg Hx    Colon cancer Neg Hx    Colon polyps Neg Hx     Social History Social History   Tobacco Use   Smoking status: Never   Smokeless tobacco: Never  Vaping Use   Vaping Use: Never used  Substance Use Topics   Alcohol use: Yes   Drug use: No     Allergies   Ibuprofen, Nsaids, Peanut oil, Peanut-containing drug products, Bee venom, Other, and Codeine   Review of Systems Review of Systems Per HPI  Physical Exam Triage Vital Signs ED Triage Vitals [06/20/22 1533]  Enc Vitals Group     BP (!) 140/86     Pulse Rate 72     Resp 18     Temp 99 F (37.2 C)     Temp Source Oral     SpO2 96 %     Weight      Height      Head Circumference      Peak Flow      Pain Score      Pain Loc      Pain Edu?      Excl. in [place]?    No  data found.  Updated Vital Signs BP (!) 140/86 (BP Location: Right Arm)   Pulse 72   Temp 99 F (37.2 C) (Oral)   Resp 18   SpO2 96%   Visual Acuity Right Eye Distance:   Left Eye Distance:   Bilateral Distance:    Right Eye Near:   Left  Eye Near:    Bilateral Near:     Physical Exam Vitals and nursing note reviewed.  Constitutional:      General: He is not in acute distress.    Appearance: He is well-developed.  HENT:     Head: Normocephalic and atraumatic.     Right Ear: Tympanic membrane, ear canal and external ear normal.     Left Ear: Tympanic membrane, ear canal and external ear normal.     Nose: Congestion present. No rhinorrhea.     Mouth/Throat:     Mouth: Mucous membranes are moist.     Pharynx: Posterior oropharyngeal erythema present.     Comments: Cobblestoning present on posterior oropharynx Eyes:     Extraocular Movements: Extraocular movements intact.     Conjunctiva/sclera: Conjunctivae normal.     Pupils: Pupils are equal, round, and reactive to light.  Cardiovascular:     Rate and Rhythm: Normal rate and regular rhythm.     Heart sounds: No murmur heard. Pulmonary:     Effort: Pulmonary effort is normal. No respiratory distress.     Breath sounds: Normal breath sounds.  Abdominal:     Palpations: Abdomen is soft.     Tenderness: There is no abdominal tenderness.  Musculoskeletal:        General: No swelling.     Cervical back: Normal range of motion.  Skin:    General: Skin is warm and dry.     Capillary Refill: Capillary refill takes less than 2 seconds.  Neurological:     Mental Status: He is alert.  Psychiatric:        Mood and Affect: Mood normal.        Behavior: Behavior normal.      UC Treatments / Results  Labs (all labs ordered are listed, but only abnormal results are displayed) Labs Reviewed - No data to display  EKG   Radiology No results found.  Procedures Procedures (including critical care time)  Medications  Ordered in UC Medications - No data to display  Initial Impression / Assessment and Plan / UC Course  I have reviewed the triage vital signs and the nursing notes.  Pertinent labs & imaging results that were available during my care of the patient were reviewed by me and considered in my medical decision making (see chart for details).  Patient is well-appearing, he is in no acute distress, vital signs are stable.  Suspect a viral upper respiratory infection with cough at this time.  No indication to perform testing as this will not change the course of treatment.  Will provide patient symptomatic treatment with Promethazine DM for his cough at nighttime.  He was advised he can continue Mucinex during the daytime.  He was also prescribed fluticasone 50 mcg nasal spray to help with his nasal congestion and runny nose.  Supportive care recommendations were provided to the patient along with strict follow-up precautions.  Patient verbalizes understanding.  All questions were answered.  Patient stable for discharge.  Final Clinical Impressions(s) / UC Diagnoses   Final diagnoses:  Viral upper respiratory tract infection with cough     Discharge Instructions      Take medication as prescribed. Increase fluids and allow for plenty of rest. Warm salt water gargles 3-4 times daily to help with throat pain or discomfort. Recommend using a humidifier at bedtime during sleep to help with cough and nasal congestion. Sleep elevated on 2 pillows. If your symptoms do not improve within the  next 5 to 7 days, or suddenly worsen, please follow-up in this clinic or with your primary care physician for further evaluation. Follow-up as needed.     ED Prescriptions     Medication Sig Dispense Auth. Provider   promethazine-dextromethorphan (PROMETHAZINE-DM) 6.25-15 MG/5ML syrup Take 5 mLs by mouth 4 (four) times daily as needed for cough. 118 mL Anamarie Hunn-Warren, Alda Lea, NP   fluticasone (FLONASE) 50  MCG/ACT nasal spray Place 2 sprays into both nostrils daily. 16 g Eren Ryser-Warren, Alda Lea, NP      PDMP not reviewed this encounter.   Tish Men, NP 06/20/22 1557

## 2022-06-20 NOTE — ED Triage Notes (Signed)
Pt reports wheezing, cough and congestion x 6 days. Reports the last 2 night he is vomiting phlegm. Reports he uses a CPAP and he needs to sit to be able to sleep to du the phlegm. Mucinex gives some relief.

## 2023-09-23 ENCOUNTER — Other Ambulatory Visit (HOSPITAL_BASED_OUTPATIENT_CLINIC_OR_DEPARTMENT_OTHER): Payer: Self-pay

## 2023-09-23 MED ORDER — MOUNJARO 10 MG/0.5ML ~~LOC~~ SOAJ
10.0000 mg | SUBCUTANEOUS | 1 refills | Status: DC
  Filled 2023-09-23: qty 2, 28d supply, fill #0
  Filled 2023-10-20: qty 2, 28d supply, fill #1

## 2023-09-23 MED ORDER — NEBIVOLOL HCL 20 MG PO TABS
20.0000 mg | ORAL_TABLET | Freq: Every day | ORAL | 3 refills | Status: AC
  Filled 2023-09-23: qty 90, 90d supply, fill #0
  Filled 2023-12-23: qty 90, 90d supply, fill #1
  Filled 2024-03-20: qty 90, 90d supply, fill #2

## 2023-09-23 MED ORDER — NEBIVOLOL HCL 20 MG PO TABS
20.0000 mg | ORAL_TABLET | Freq: Every day | ORAL | 3 refills | Status: DC
  Filled 2023-09-23: qty 90, 90d supply, fill #0

## 2023-09-23 MED ORDER — EPINEPHRINE 0.3 MG/0.3ML IJ SOAJ
INTRAMUSCULAR | 3 refills | Status: AC
  Filled 2023-09-28: qty 2, 30d supply, fill #0
  Filled 2023-10-24: qty 2, 30d supply, fill #1

## 2023-09-23 MED ORDER — OMEPRAZOLE 40 MG PO CPDR
40.0000 mg | DELAYED_RELEASE_CAPSULE | Freq: Every day | ORAL | 3 refills | Status: DC
  Filled 2023-09-28: qty 90, 90d supply, fill #0

## 2023-09-24 ENCOUNTER — Other Ambulatory Visit: Payer: Self-pay

## 2023-09-24 ENCOUNTER — Other Ambulatory Visit (HOSPITAL_BASED_OUTPATIENT_CLINIC_OR_DEPARTMENT_OTHER): Payer: Self-pay

## 2023-09-28 ENCOUNTER — Other Ambulatory Visit (HOSPITAL_BASED_OUTPATIENT_CLINIC_OR_DEPARTMENT_OTHER): Payer: Self-pay

## 2023-10-01 ENCOUNTER — Other Ambulatory Visit (HOSPITAL_BASED_OUTPATIENT_CLINIC_OR_DEPARTMENT_OTHER): Payer: Self-pay

## 2023-10-05 ENCOUNTER — Other Ambulatory Visit (HOSPITAL_BASED_OUTPATIENT_CLINIC_OR_DEPARTMENT_OTHER): Payer: Self-pay

## 2023-10-06 ENCOUNTER — Other Ambulatory Visit (HOSPITAL_BASED_OUTPATIENT_CLINIC_OR_DEPARTMENT_OTHER): Payer: Self-pay

## 2023-10-06 MED ORDER — AMLODIPINE BESYLATE 5 MG PO TABS
5.0000 mg | ORAL_TABLET | Freq: Every day | ORAL | 3 refills | Status: AC
  Filled 2023-10-06: qty 90, 90d supply, fill #0
  Filled 2024-01-06: qty 90, 90d supply, fill #1
  Filled 2024-03-20: qty 90, 90d supply, fill #2

## 2023-10-19 ENCOUNTER — Other Ambulatory Visit (HOSPITAL_BASED_OUTPATIENT_CLINIC_OR_DEPARTMENT_OTHER): Payer: Self-pay

## 2023-10-20 ENCOUNTER — Other Ambulatory Visit (HOSPITAL_BASED_OUTPATIENT_CLINIC_OR_DEPARTMENT_OTHER): Payer: Self-pay

## 2023-11-15 ENCOUNTER — Other Ambulatory Visit (HOSPITAL_BASED_OUTPATIENT_CLINIC_OR_DEPARTMENT_OTHER): Payer: Self-pay

## 2023-11-17 ENCOUNTER — Other Ambulatory Visit (HOSPITAL_BASED_OUTPATIENT_CLINIC_OR_DEPARTMENT_OTHER): Payer: Self-pay

## 2023-11-17 MED ORDER — MOUNJARO 10 MG/0.5ML ~~LOC~~ SOAJ
10.0000 mg | SUBCUTANEOUS | 3 refills | Status: DC
  Filled 2023-11-17: qty 2, 28d supply, fill #0

## 2023-11-18 ENCOUNTER — Other Ambulatory Visit (HOSPITAL_BASED_OUTPATIENT_CLINIC_OR_DEPARTMENT_OTHER): Payer: Self-pay

## 2023-12-13 ENCOUNTER — Other Ambulatory Visit (HOSPITAL_BASED_OUTPATIENT_CLINIC_OR_DEPARTMENT_OTHER): Payer: Self-pay

## 2023-12-13 MED ORDER — MOUNJARO 12.5 MG/0.5ML ~~LOC~~ SOAJ
12.5000 mg | SUBCUTANEOUS | 5 refills | Status: DC
  Filled 2023-12-13: qty 2, 28d supply, fill #0
  Filled 2024-01-12: qty 2, 28d supply, fill #1

## 2023-12-15 ENCOUNTER — Other Ambulatory Visit (HOSPITAL_BASED_OUTPATIENT_CLINIC_OR_DEPARTMENT_OTHER): Payer: Self-pay

## 2023-12-15 MED ORDER — OMEPRAZOLE 40 MG PO CPDR
40.0000 mg | DELAYED_RELEASE_CAPSULE | Freq: Every day | ORAL | 3 refills | Status: AC
  Filled 2023-12-15: qty 90, 90d supply, fill #0
  Filled 2024-03-02: qty 90, 90d supply, fill #1
  Filled 2024-04-27: qty 90, 90d supply, fill #2

## 2023-12-23 ENCOUNTER — Other Ambulatory Visit (HOSPITAL_BASED_OUTPATIENT_CLINIC_OR_DEPARTMENT_OTHER): Payer: Self-pay

## 2023-12-23 ENCOUNTER — Other Ambulatory Visit: Payer: Self-pay

## 2023-12-24 ENCOUNTER — Other Ambulatory Visit: Payer: Self-pay

## 2024-01-13 ENCOUNTER — Other Ambulatory Visit (HOSPITAL_BASED_OUTPATIENT_CLINIC_OR_DEPARTMENT_OTHER): Payer: Self-pay

## 2024-02-02 ENCOUNTER — Other Ambulatory Visit (HOSPITAL_BASED_OUTPATIENT_CLINIC_OR_DEPARTMENT_OTHER): Payer: Self-pay

## 2024-02-02 MED ORDER — MOUNJARO 15 MG/0.5ML ~~LOC~~ SOAJ
15.0000 mg | SUBCUTANEOUS | 12 refills | Status: AC
  Filled 2024-02-02: qty 2, 28d supply, fill #0
  Filled 2024-03-02: qty 2, 28d supply, fill #1
  Filled 2024-03-30: qty 2, 28d supply, fill #2
  Filled 2024-04-27: qty 2, 28d supply, fill #3
  Filled 2024-05-09: qty 0.5, 7d supply, fill #3
  Filled 2024-05-12: qty 2, 28d supply, fill #3

## 2024-03-02 ENCOUNTER — Other Ambulatory Visit (HOSPITAL_BASED_OUTPATIENT_CLINIC_OR_DEPARTMENT_OTHER): Payer: Self-pay

## 2024-03-04 ENCOUNTER — Other Ambulatory Visit (HOSPITAL_BASED_OUTPATIENT_CLINIC_OR_DEPARTMENT_OTHER): Payer: Self-pay

## 2024-03-04 MED ORDER — ALPRAZOLAM 1 MG PO TABS
1.0000 mg | ORAL_TABLET | Freq: Every day | ORAL | 1 refills | Status: AC | PRN
  Filled 2024-03-04: qty 90, 90d supply, fill #0
  Filled 2024-04-27: qty 90, 90d supply, fill #1

## 2024-03-30 ENCOUNTER — Other Ambulatory Visit (HOSPITAL_BASED_OUTPATIENT_CLINIC_OR_DEPARTMENT_OTHER): Payer: Self-pay

## 2024-04-27 ENCOUNTER — Other Ambulatory Visit (HOSPITAL_BASED_OUTPATIENT_CLINIC_OR_DEPARTMENT_OTHER): Payer: Self-pay

## 2024-04-28 ENCOUNTER — Other Ambulatory Visit: Payer: Self-pay

## 2024-05-08 ENCOUNTER — Other Ambulatory Visit (HOSPITAL_BASED_OUTPATIENT_CLINIC_OR_DEPARTMENT_OTHER): Payer: Self-pay

## 2024-05-08 MED ORDER — DAPAGLIFLOZIN PROPANEDIOL 10 MG PO TABS
10.0000 mg | ORAL_TABLET | Freq: Every day | ORAL | 3 refills | Status: AC
  Filled 2024-05-08: qty 90, 90d supply, fill #0

## 2024-05-09 ENCOUNTER — Other Ambulatory Visit (HOSPITAL_BASED_OUTPATIENT_CLINIC_OR_DEPARTMENT_OTHER): Payer: Self-pay

## 2024-05-10 ENCOUNTER — Other Ambulatory Visit (HOSPITAL_BASED_OUTPATIENT_CLINIC_OR_DEPARTMENT_OTHER): Payer: Self-pay

## 2024-05-12 ENCOUNTER — Other Ambulatory Visit (HOSPITAL_BASED_OUTPATIENT_CLINIC_OR_DEPARTMENT_OTHER): Payer: Self-pay

## 2024-05-15 ENCOUNTER — Other Ambulatory Visit (HOSPITAL_BASED_OUTPATIENT_CLINIC_OR_DEPARTMENT_OTHER): Payer: Self-pay

## 2024-05-17 ENCOUNTER — Other Ambulatory Visit (HOSPITAL_BASED_OUTPATIENT_CLINIC_OR_DEPARTMENT_OTHER): Payer: Self-pay

## 2024-05-17 ENCOUNTER — Encounter: Payer: Self-pay | Admitting: Dermatology

## 2024-05-17 ENCOUNTER — Ambulatory Visit: Admitting: Dermatology

## 2024-05-17 DIAGNOSIS — L578 Other skin changes due to chronic exposure to nonionizing radiation: Secondary | ICD-10-CM | POA: Diagnosis not present

## 2024-05-17 DIAGNOSIS — L814 Other melanin hyperpigmentation: Secondary | ICD-10-CM | POA: Diagnosis not present

## 2024-05-17 DIAGNOSIS — D1801 Hemangioma of skin and subcutaneous tissue: Secondary | ICD-10-CM | POA: Diagnosis not present

## 2024-05-17 DIAGNOSIS — Z1283 Encounter for screening for malignant neoplasm of skin: Secondary | ICD-10-CM

## 2024-05-17 DIAGNOSIS — L821 Other seborrheic keratosis: Secondary | ICD-10-CM

## 2024-05-17 DIAGNOSIS — Z85828 Personal history of other malignant neoplasm of skin: Secondary | ICD-10-CM | POA: Diagnosis not present

## 2024-05-17 DIAGNOSIS — Z8589 Personal history of malignant neoplasm of other organs and systems: Secondary | ICD-10-CM

## 2024-05-17 DIAGNOSIS — L570 Actinic keratosis: Secondary | ICD-10-CM

## 2024-05-17 DIAGNOSIS — W908XXA Exposure to other nonionizing radiation, initial encounter: Secondary | ICD-10-CM

## 2024-05-17 DIAGNOSIS — D229 Melanocytic nevi, unspecified: Secondary | ICD-10-CM

## 2024-05-17 MED ORDER — FLUOROURACIL 5 % EX CREA
TOPICAL_CREAM | Freq: Two times a day (BID) | CUTANEOUS | 2 refills | Status: AC
  Filled 2024-05-17: qty 40, 30d supply, fill #0

## 2024-05-17 NOTE — Progress Notes (Signed)
 "  New Patient Visit   History of Present Illness Antonio Houston is a 69 year old male with squamous cell carcinoma who presents for dermatologic consultation regarding skin cancer management and full body skin check.  He has a history of squamous cell carcinoma primarily located on his face, with the most recent lesion treated on December 23rd. The lesion was stitched, and a compounded cream was prescribed for application on his face for one week and on his head for two weeks. However, he found the cream insufficient as it ran out in two and a half days. He prefers using generic Efudex  (fluorouracil ) as he finds it more effective.  He has been receiving regular skin checks every three months, which were extended to six months during his last visit. During these checks, two lesions were found and treated with cryotherapy. One lesion on his chest was particularly itchy before being frozen.  He has undergone multiple Mohs surgeries for squamous cell carcinoma, including one on his eyebrow and another near the corner of his eye. He reports persistent itching and discomfort in these areas following surgery. His right eye is beginning to exhibit similar symptoms to those previously treated.  He wants to complete his treatment quickly, despite the discomfort, stating, 'I don't care how much it hurts me. I want it over with.' He uses sunscreen with SPF 50 and typically wears a hat to protect his skin from sun exposure.  The following portions of the chart were reviewed this encounter and updated as appropriate: medications, allergies, medical history  Review of Systems:  No other skin or systemic complaints except as noted in HPI or Assessment and Plan.  Objective  Well appearing patient in no apparent distress; mood and affect are within normal limits.  A full examination was performed including scalp, head, eyes, ears, nose, lips, neck, chest, axillae, abdomen, back, buttocks, bilateral upper  extremities, bilateral lower extremities, hands, feet, fingers, toes, fingernails, and toenails. All findings within normal limits unless otherwise noted below.   Relevant physical exam findings are noted in the Assessment and Plan.  Left Upper Arm - Anterior x 2, left chest x 1, left lower leg x 1, right lower back x 1, chest x 1 (6) Erythematous thin papules/macules with gritty scale.   Assessment & Plan   SKIN CANCER SCREENING PERFORMED TODAY.  ACTINIC DAMAGE - Chronic condition, secondary to cumulative UV/sun exposure - diffuse scaly erythematous macules with underlying dyspigmentation - Recommend daily broad spectrum sunscreen SPF 30+ to sun-exposed areas, reapply every 2 hours as needed.  - Staying in the shade or wearing long sleeves, sun glasses (UVA+UVB protection) and wide brim hats (4-inch brim around the entire circumference of the hat) are also recommended for sun protection.  - Call for new or changing lesions.  ACTINIC KERATOSIS Exam: Erythematous thin papules/macules with gritty scale at the forehead and scalp x 3 weeks BID, temples, ears and cheeks for 2 weeks BID  Actinic keratoses are precancerous spots that appear secondary to cumulative UV radiation exposure/sun exposure over time. They are chronic with expected duration over 1 year. A portion of actinic keratoses will progress to squamous cell carcinoma of the skin. It is not possible to reliably predict which spots will progress to skin cancer and so treatment is recommended to prevent development of skin cancer.  Recommend daily broad spectrum sunscreen SPF 30+ to sun-exposed areas, reapply every 2 hours as needed.  Recommend staying in the shade or wearing long sleeves,  sun glasses (UVA+UVB protection) and wide brim hats (4-inch brim around the entire circumference of the hat). Call for new or changing lesions.  Treatment Plan: Start 5-fluorouracil  cream gritty scale at the forehead and scalp x 3 weeks BID,  temples, ears and cheeks for 2 weeks BID. Reviewed course of treatment and expected reaction.  Patient advised to expect inflammation and crusting and advised that erosions are possible.  Patient advised to be diligent with sun protection during and after treatment. Handout with details of how to apply medication and what to expect provided. Counseled to keep medication out of reach of children and pets.  Reviewed course of treatment and expected reaction.  Patient advised to expect inflammation and crusting and advised that erosions are possible.  Patient advised to be diligent with sun protection during and after treatment. Handout with details of how to apply medication and what to expect provided. Counseled to keep medication out of reach of children and pets.  MELANOCYTIC NEVI - Tan-brown and/or pink-flesh-colored symmetric macules and papules - Benign appearing on exam today - Observation - Call clinic for new or changing moles - Recommend daily use of broad spectrum spf 30+ sunscreen to sun-exposed areas.   LENTIGINES Exam: scattered tan macules Due to sun exposure Treatment Plan: Benign-appearing, observe. Recommend daily broad spectrum sunscreen SPF 30+ to sun-exposed areas, reapply every 2 hours as needed.  Call for any changes   HEMANGIOMA Exam: red papule(s) Discussed benign nature. Recommend observation. Call for changes.   SEBORRHEIC KERATOSIS - Stuck-on, waxy, tan-brown papules and/or plaques  - Benign-appearing - Discussed benign etiology and prognosis. - Observe - Call for any changes   HISTORY OF SQUAMOUS CELL CARCINOMA OF THE SKIN - No evidence of recurrence today - Recommend regular full body skin exams - Recommend daily broad spectrum sunscreen SPF 30+ to sun-exposed areas, reapply every 2 hours as needed.  - Call if any new or changing lesions are noted between office visits AK (ACTINIC KERATOSIS) (6) Left Upper Arm - Anterior x 2, left chest x 1, left lower  leg x 1, right lower back x 1, chest x 1 (6) - Destruction of lesion - Left Upper Arm - Anterior x 2, left chest x 1, left lower leg x 1, right lower back x 1, chest x 1 (6) Complexity: simple   Destruction method: cryotherapy   Outcome: patient tolerated procedure well with no complications   Post-procedure details: wound care instructions given    ACTINIC SKIN DAMAGE   LENTIGINES   CHERRY ANGIOMA   MULTIPLE BENIGN NEVI   SEBORRHEIC KERATOSIS   HISTORY OF SQUAMOUS CELL CARCINOMA    Return in about 6 months (around 11/14/2024) for TBSE, AK f/u in 2 months.  I, Antonio Houston, CMA, am acting as scribe for Antonio CHRISTELLA HOLY, MD.   Documentation: I have reviewed the above documentation for accuracy and completeness, and I agree with the above.  Antonio CHRISTELLA HOLY, MD    "

## 2024-05-17 NOTE — Patient Instructions (Addendum)

## 2024-07-13 ENCOUNTER — Ambulatory Visit: Admitting: Dermatology

## 2024-11-20 ENCOUNTER — Ambulatory Visit: Admitting: Dermatology
